# Patient Record
Sex: Male | Born: 1970
Health system: Southern US, Community
[De-identification: ages and names within clinical notes are randomized; demographics above are authoritative.]

## PROBLEM LIST (undated history)

## (undated) DIAGNOSIS — K219 Gastro-esophageal reflux disease without esophagitis: Secondary | ICD-10-CM

## (undated) DIAGNOSIS — I10 Essential (primary) hypertension: Secondary | ICD-10-CM

## (undated) DIAGNOSIS — J329 Chronic sinusitis, unspecified: Secondary | ICD-10-CM

## (undated) DIAGNOSIS — M940 Chondrocostal junction syndrome [Tietze]: Secondary | ICD-10-CM

## (undated) DIAGNOSIS — T7840XA Allergy, unspecified, initial encounter: Secondary | ICD-10-CM

## (undated) DIAGNOSIS — R011 Cardiac murmur, unspecified: Secondary | ICD-10-CM

## (undated) DIAGNOSIS — E785 Hyperlipidemia, unspecified: Secondary | ICD-10-CM

## (undated) HISTORY — DX: Cardiac murmur, unspecified: R01.1

## (undated) HISTORY — DX: Essential (primary) hypertension: I10

## (undated) HISTORY — DX: Hyperlipidemia, unspecified: E78.5

## (undated) HISTORY — DX: Allergy, unspecified, initial encounter: T78.40XA

## (undated) HISTORY — PX: WISDOM TOOTH EXTRACTION: SHX21

## (undated) HISTORY — DX: Chondrocostal junction syndrome (tietze): M94.0

---

## 2011-11-19 ENCOUNTER — Emergency Department (INDEPENDENT_AMBULATORY_CARE_PROVIDER_SITE_OTHER): Payer: PRIVATE HEALTH INSURANCE

## 2011-11-19 ENCOUNTER — Emergency Department
Admission: EM | Admit: 2011-11-19 | Discharge: 2011-11-19 | Disposition: A | Discharge status: Home / Self Care | Payer: PRIVATE HEALTH INSURANCE | Source: Home / Self Care | Attending: Family Medicine | Admitting: Family Medicine

## 2011-11-19 ENCOUNTER — Encounter: Payer: Self-pay | Admitting: Emergency Medicine

## 2011-11-19 DIAGNOSIS — S20219A Contusion of unspecified front wall of thorax, initial encounter: Secondary | ICD-10-CM

## 2011-11-19 DIAGNOSIS — R079 Chest pain, unspecified: Secondary | ICD-10-CM

## 2011-11-19 DIAGNOSIS — IMO0002 Reserved for concepts with insufficient information to code with codable children: Secondary | ICD-10-CM

## 2011-11-19 NOTE — ED Provider Notes (Signed)
History     CSN: [id number]  Arrival date & time 11/19/11  1200   First MD Initiated Contact with Patient 11/19/11 1236      Chief Complaint  Patient presents with  . Rib Injury      HPI Comments: One week ago while working on a step ladder propped against a door, the ladder fell and he landed on top of the ladder striking his left anterior chest.  He has had persistent soreness in his left anterior chest but no shortness of breath  Patient is a 41 y.o. male presenting with chest pain. The history is provided by the patient.  Chest Pain Episode onset: 6 days ago. Chest pain occurs intermittently. The chest pain is improving. The pain is associated with lifting. At its most intense, the pain is at 2/10. The pain is currently at 1/10. The severity of the pain is mild. The quality of the pain is described as aching. The pain does not radiate. Chest pain is worsened by certain positions. Pertinent negatives for primary symptoms include no fever, no shortness of breath, no cough, no wheezing, no palpitations, no nausea and no vomiting. Associated symptoms comments: none. He tried nothing for the symptoms.     History reviewed. No pertinent past medical history.  History reviewed. No pertinent past surgical history.  Family History  Problem Relation Age of Onset  . Hypertension Father     History  Substance Use Topics  . Smoking status: Never Smoker   . Smokeless tobacco: Not on file  . Alcohol Use: Yes      Review of Systems  Constitutional: Negative for fever.  Respiratory: Negative for cough, shortness of breath and wheezing.   Cardiovascular: Positive for chest pain. Negative for palpitations.  Gastrointestinal: Negative for nausea and vomiting.  All other systems reviewed and are negative.    Allergies  Review of patient's allergies indicates no known allergies.  Home Medications  No current outpatient prescriptions on file.  BP 149/85  Pulse 99  Temp 98.2 F  (36.8 C) (Oral)  Resp 18  Ht 5\' 7"  (1.702 m)  Wt 227 lb (102.967 kg)  BMI 35.55 kg/m2  SpO2 97%  Physical Exam  Nursing note and vitals reviewed. Constitutional: He is oriented to person, place, and time. He appears well-developed and well-nourished.       Patient is obese (BMI 35.6)    HENT:  Head: Atraumatic.  Eyes: Conjunctivae normal are normal. Pupils are equal, round, and reactive to light.  Cardiovascular: Normal heart sounds.   Pulmonary/Chest: Breath sounds normal.  Abdominal: Soft. There is no tenderness.  Musculoskeletal:       Arms:      There is tenderness over the left anterior chest as noted on diagram, with a small area of resolving ecchymosis near the sternum.  No crepitance.  Lymphadenopathy:    He has no cervical adenopathy.  Neurological: He is alert and oriented to person, place, and time.  Skin: Skin is warm and dry. No rash noted.    ED Course  Procedures none   Dg Ribs Unilateral W/chest Left  11/19/2011  *RADIOLOGY REPORT*  Clinical Data: Chest pain/rib injury  LEFT RIBS AND CHEST - 3+ VIEW  Comparison: None.  Findings: Lungs are clear. No pleural effusion or pneumothorax.  Cardiomediastinal silhouette is within normal limits.  No displaced left rib fracture is seen.  IMPRESSION: No displaced left rib fracture is seen.   Original Report Authenticated By: Charline Bills, M.D.  1. Contusion, chest wall       MDM   Rib belt applied Wear rib belt Apply heating pad once or twice daily. May take Ibuprofen 200mg , 4 tabs every 8 hours with food. Followup with Dr. Rodney Langton if not improved two weeks        Lattie Haw, MD 11/19/11 [phone]

## 2011-11-19 NOTE — ED Notes (Signed)
Patient reports falling off ladder 6 days ago during home project; ladder also fell on his left rib and abdominal area; now has bruising and some continued discomfort; no pain upon inspiration.

## 2012-09-06 ENCOUNTER — Encounter: Payer: Self-pay | Admitting: *Deleted

## 2012-09-06 ENCOUNTER — Emergency Department
Admission: EM | Admit: 2012-09-06 | Discharge: 2012-09-06 | Disposition: A | Discharge status: Home / Self Care | Payer: PRIVATE HEALTH INSURANCE | Source: Home / Self Care | Attending: Family Medicine | Admitting: Family Medicine

## 2012-09-06 DIAGNOSIS — J029 Acute pharyngitis, unspecified: Secondary | ICD-10-CM

## 2012-09-06 DIAGNOSIS — J069 Acute upper respiratory infection, unspecified: Secondary | ICD-10-CM

## 2012-09-06 MED ORDER — AZITHROMYCIN 250 MG PO TABS: ORAL_TABLET | ORAL | Status: DC

## 2012-09-06 MED ORDER — BENZONATATE 100 MG PO CAPS: ORAL_CAPSULE | ORAL | Status: DC

## 2012-09-06 NOTE — ED Provider Notes (Signed)
History    CSN: [id number] Arrival date & time 09/06/12  1624  First MD Initiated Contact with Patient 09/06/12 1640     Chief Complaint  Patient presents with  . Sore Throat      HPI Comments: Patient complains of four day history of sore throat, sinus congestion, and mild productive cough.  No fevers, chills, and sweats.  He does not feel ill.  The history is provided by the patient.   History reviewed. No pertinent past medical history. History reviewed. No pertinent past surgical history. Family History  Problem Relation Age of Onset  . Hypertension Father    History  Substance Use Topics  . Smoking status: Never Smoker   . Smokeless tobacco: Not on file  . Alcohol Use: Yes    Review of Systems + sore throat + cough No pleuritic pain No wheezing + nasal congestion + post-nasal drainage No sinus pain/pressure No itchy/red eyes No earache No hemoptysis No SOB No fever/chills No nausea No vomiting No abdominal pain No diarrhea No urinary symptoms No skin rashes + fatigue No myalgias No headache Used OTC meds without relief  Allergies  Review of patient's allergies indicates no known allergies.  Home Medications   Current Outpatient Rx  Name  Route  Sig  Dispense  Refill  . azithromycin (ZITHROMAX Z-PAK) 250 MG tablet      Take 2 tabs today; then begin one tab once daily for 4 more days. (Rx void after 09/14/12)   6 each   0   . benzonatate (TESSALON) 100 MG capsule      Take one cap at bedtime as necessary for cough   12 capsule   0    BP 152/95  Pulse 102  Temp(Src) 98.3 F (36.8 C) (Oral)  Resp 18  Ht 5\' 7"  (1.702 m)  Wt 227 lb (102.967 kg)  BMI 35.55 kg/m2  SpO2 98% Physical Exam Nursing notes and Vital Signs reviewed. Appearance:  Patient appears stated age, and in no acute distress.  Patient is obese (BMI 35.6) Eyes:  Pupils are equal, round, and reactive to light and accomodation.  Extraocular movement is intact.  Conjunctivae  are not inflamed  Ears:  Canals normal.  Tympanic membranes normal.  Nose:  Mildly congested turbinates.  No sinus tenderness.   Pharynx:  Normal Neck:  Supple.  Non-tender prominent posterior nodes are palpated bilaterally  Lungs:  Clear to auscultation.  Breath sounds are equal.  Heart:  Regular rate and rhythm without murmurs, rubs, or gallops.  Abdomen:  Nontender without masses or hepatosplenomegaly.  Bowel sounds are present.  No CVA or flank tenderness.  Extremities:  No edema.  No calf tenderness Skin:  No rash present.   ED Course  Procedures  None   Labs Reviewed  POCT RAPID STREP A (OFFICE) negative    1. Acute pharyngitis   2. Acute upper respiratory infections of unspecified site; suspect viral URI Note elevated BP today    MDM  There is no evidence of bacterial infection today.   Treat symptomatically for now  Prescription written for Benzonatate (Tessalon) to take at bedtime for night-time cough.  Take Mucinex D (guaifenesin with decongestant) twice daily for congestion.  Increase fluid intake, rest. May use Afrin nasal spray (or generic oxymetazoline) twice daily for about 5 days.  Also recommend using saline nasal spray several times daily and saline nasal irrigation (AYR is a common brand) Stop all antihistamines for now, and other non-prescription cough/cold  preparations. Begin Azithromycin if not improving about one week or if persistent fever develops (Given a prescription to hold, with an expiration date)  Recommend that he monitor BP Follow-up with family doctor if not improving about10 days.   Lattie Haw, MD 09/06/12 [phone]

## 2012-09-06 NOTE — ED Notes (Signed)
Pt c/o sore throat and nasal congestion x 3 days. Denies fever.

## 2012-09-08 ENCOUNTER — Telehealth: Payer: Self-pay | Admitting: Emergency Medicine

## 2012-09-14 ENCOUNTER — Emergency Department (INDEPENDENT_AMBULATORY_CARE_PROVIDER_SITE_OTHER): Payer: PRIVATE HEALTH INSURANCE

## 2012-09-14 ENCOUNTER — Emergency Department
Admission: EM | Admit: 2012-09-14 | Discharge: 2012-09-14 | Disposition: A | Discharge status: Home / Self Care | Payer: PRIVATE HEALTH INSURANCE | Source: Home / Self Care | Attending: Family Medicine | Admitting: Family Medicine

## 2012-09-14 ENCOUNTER — Encounter: Payer: Self-pay | Admitting: *Deleted

## 2012-09-14 DIAGNOSIS — R51 Headache: Secondary | ICD-10-CM

## 2012-09-14 DIAGNOSIS — J320 Chronic maxillary sinusitis: Secondary | ICD-10-CM

## 2012-09-14 DIAGNOSIS — J0100 Acute maxillary sinusitis, unspecified: Secondary | ICD-10-CM

## 2012-09-14 MED ORDER — FLUTICASONE PROPIONATE 50 MCG/ACT NA SUSP: NASAL | Status: DC

## 2012-09-14 MED ORDER — AMOXICILLIN 875 MG PO TABS: 875.0000 mg | ORAL_TABLET | Freq: Two times a day (BID) | ORAL | Status: DC

## 2012-09-14 NOTE — ED Notes (Signed)
Kevin Bailey c/o sinus congestion and sinus pain with cough at night. He was treated last week for UTI symptoms and completed z-pack. He "feels like it moved to my sinuses".

## 2012-09-14 NOTE — ED Provider Notes (Signed)
CSN: [id number]     Arrival date & time 09/14/12  1100 History     First MD Initiated Contact with Patient 09/14/12 1115     Chief Complaint  Patient presents with  . Sinus Problem      HPI Comments: Patient reports that his cough has improved but he has persistent pressure sensation in his sinuses, worse on the left.  Three days ago he had pain in his left upper teeth.  He has persistent post-nasal drainage.  No fevers, chills, and sweats.  He has a history of seasonal rhinitis.  The history is provided by the patient.    History reviewed. No pertinent past medical history. History reviewed. No pertinent past surgical history. Family History  Problem Relation Age of Onset  . Hypertension Father    History  Substance Use Topics  . Smoking status: Never Smoker   . Smokeless tobacco: Not on file  . Alcohol Use: Yes    Review of Systems No sore throat + cough No pleuritic pain No wheezing + nasal congestion + post-nasal drainage + sinus pain/pressure No itchy/red eyes No earache No hemoptysis No SOB No fever/chills No nausea No vomiting No abdominal pain No diarrhea No urinary symptoms No skin rashes No fatigue No myalgias No headache    Allergies  Review of patient's allergies indicates no known allergies.  Home Medications   Current Outpatient Rx  Name  Route  Sig  Dispense  Refill  . amoxicillin (AMOXIL) 875 MG tablet   Oral   Take 1 tablet (875 mg total) by mouth 2 (two) times daily.   20 tablet   0   . azithromycin (ZITHROMAX Z-PAK) 250 MG tablet      Take 2 tabs today; then begin one tab once daily for 4 more days. (Rx void after 09/14/12)   6 each   0   . benzonatate (TESSALON) 100 MG capsule      Take one cap at bedtime as necessary for cough   12 capsule   0   . fluticasone (FLONASE) 50 MCG/ACT nasal spray      Place two sprays in each nostril once daily   16 g   1    BP 133/83  Pulse 111  Temp(Src) 98.2 F (36.8 C) (Oral)   Resp 18  Ht 5\' 7"  (1.702 m)  Wt 223 lb (101.152 kg)  BMI 34.92 kg/m2  SpO2 95% Physical Exam Nursing notes and Vital Signs reviewed. Appearance:  Patient appears stated age, and in no acute distress.  Patient is obese (BMI 34.9) Eyes:  Pupils are equal, round, and reactive to light and accomodation.  Extraocular movement is intact.  Conjunctivae are not inflamed  Ears:  Canals normal.  Tympanic membranes normal.  Nose:  Congested turbinates.  Mild left maxillary sinus tenderness is present.  Pharynx:  Normal Neck:  Supple.   No adenopathy Lungs:  Clear to auscultation.  Breath sounds are equal.  Heart:  Regular rate and rhythm without murmurs, rubs, or gallops.  Skin:  No rash present.    ED Course   Procedures  none  Labs Reviewed -   Dg Sinuses Complete  09/14/2012   *RADIOLOGY REPORT*  Clinical Data: Persistent sinus congestion and left facial pain.  PARANASAL SINUSES - COMPLETE 3 + VIEW  Comparison: None  Findings: There is acute left maxillary sinusitis with air-fluid levels.  The right maxillary sinus is clear and the frontal and ethmoid sinuses appear clear.  The sphenoid  sinus is clear on the lateral film.  IMPRESSION: Acute left maxillary sinusitis.   Original Report Authenticated By: Rudie Meyer, M.D.   1. Left maxillary sinusitis     MDM  Begin amoxicillin for 10 days.  Begin Flonase. Take plain Mucinex (guaifenesin) twice daily for cough and congestion.  Increase fluid intake, rest. May use Afrin nasal spray (or generic oxymetazoline) twice daily for about 5 days.  Also recommend using saline nasal spray several times daily and saline nasal irrigation (AYR is a common brand).  Use Flonase spray after using Afrin and saline irrigation. Stop all antihistamines for now, and other non-prescription cough/cold preparations. Followup with ENT if not improving.  Lattie Haw, MD 09/14/12 1218

## 2012-09-16 ENCOUNTER — Telehealth: Payer: Self-pay | Admitting: Emergency Medicine

## 2013-05-19 ENCOUNTER — Encounter: Payer: Self-pay | Admitting: Emergency Medicine

## 2013-05-19 ENCOUNTER — Emergency Department (INDEPENDENT_AMBULATORY_CARE_PROVIDER_SITE_OTHER)
Admission: EM | Admit: 2013-05-19 | Discharge: 2013-05-19 | Disposition: A | Discharge status: Home / Self Care | Payer: PRIVATE HEALTH INSURANCE | Source: Home / Self Care

## 2013-05-19 DIAGNOSIS — J0100 Acute maxillary sinusitis, unspecified: Secondary | ICD-10-CM

## 2013-05-19 DIAGNOSIS — J309 Allergic rhinitis, unspecified: Secondary | ICD-10-CM

## 2013-05-19 MED ORDER — AMOXICILLIN 875 MG PO TABS: 875.0000 mg | ORAL_TABLET | Freq: Two times a day (BID) | ORAL | Status: DC

## 2013-05-19 MED ORDER — AZITHROMYCIN 250 MG PO TABS: ORAL_TABLET | ORAL | Status: DC

## 2013-05-19 MED ORDER — FLUTICASONE PROPIONATE 50 MCG/ACT NA SUSP: NASAL | Status: DC

## 2013-05-19 NOTE — Discharge Instructions (Signed)
Take plain Mucinex (1200 mg guaifenesin) twice daily for cough and congestion.  Add Sudafed for sinus congestion.   Increase fluid intake. Use Afrin nasal spray (or generic oxymetazoline) twice daily for about 5 days.  Also recommend using saline nasal spray several times daily and saline nasal irrigation (AYR is a common brand).  Use Flonase spray after Afrin spray and saline rinse. Continue Claritin, Allegra, or Zyrtec Follow-up with family doctor if not improving 7 to 10 days.

## 2013-05-19 NOTE — ED Provider Notes (Signed)
CSN: [id number]     Arrival date & time 05/19/13  1102 History   None    Chief Complaint  Patient presents with  . Congestion/drainage in throat     x 1.5 weeks      HPI Comments: Patient complains of sinus congestion, facial pain, sneezing, and post-nasal drainage for about 1.5 weeks.  No sore throat, cough, or fever.  He has a history of seasonal allergies.  The history is provided by the patient.    History reviewed. No pertinent past medical history. Past Surgical History  Procedure Laterality Date  . Wisdom tooth extraction     Family History  Problem Relation Age of Onset  . Hypertension Father    History  Substance Use Topics  . Smoking status: Never Smoker   . Smokeless tobacco: Never Used  . Alcohol Use: Yes    Review of Systems No sore throat No cough No pleuritic pain No wheezing + nasal congestion + post-nasal drainage + sinus pain/pressure No itchy/red eyes No earache No hemoptysis No SOB No fever/chills No nausea No vomiting No abdominal pain No diarrhea No urinary symptoms No skin rash No fatigue No myalgias No headache Used OTC meds without relief  Allergies  Review of patient's allergies indicates no known allergies.  Home Medications   Current Outpatient Rx  Name  Route  Sig  Dispense  Refill  . Pseudoephedrine-APAP-DM (DAYQUIL MULTI-SYMPTOM COLD/FLU PO)   Oral   Take by mouth.         Marland Kitchen amoxicillin (AMOXIL) 875 MG tablet   Oral   Take 1 tablet (875 mg total) by mouth 2 (two) times daily.   20 tablet   0   . azithromycin (ZITHROMAX Z-PAK) 250 MG tablet      Take 2 tabs today; then begin one tab once daily for 4 more days.   6 each   0   . fluticasone (FLONASE) 50 MCG/ACT nasal spray      Place two sprays in each nostril once daily   16 g   1    BP 147/91  Pulse 89  Temp(Src) 98.2 F (36.8 C) (Oral)  Resp 16  Ht 5\' 7"  (1.702 m)  Wt 238 lb (107.956 kg)  BMI 37.27 kg/m2  SpO2 96% Physical Exam Nursing notes  and Vital Signs reviewed. Appearance:  Patient appears stated age, and in no acute distress.  Patient is obese (BMI 37.3) Eyes:  Pupils are equal, round, and reactive to light and accomodation.  Extraocular movement is intact.  Conjunctivae are not inflamed  Ears:  Canals normal.  Tympanic membranes normal.  Nose:  Congested turbinates.  Maxillary sinus tenderness is present.  Pharynx:  Normal Neck:  Supple.  No adenopathy Lungs:  Clear to auscultation.  Breath sounds are equal.  Heart:  Regular rate and rhythm without murmurs, rubs, or gallops.  Abdomen:  Nontender without masses or hepatosplenomegaly.  Bowel sounds are present.  No CVA or flank tenderness.  Extremities:  No edema.  No calf tenderness Skin:  No rash present.   ED Course  Procedures  None      MDM   1. Allergic rhinitis   2. Acute maxillary sinusitis    Begin amoxicillin.  Refill Flonase Take plain Mucinex (1200 mg guaifenesin) twice daily for cough and congestion.  Add Sudafed for sinus congestion.   Increase fluid intake. Use Afrin nasal spray (or generic oxymetazoline) twice daily for about 5 days.  Also recommend using saline nasal  spray several times daily and saline nasal irrigation (AYR is a common brand).  Use Flonase spray after Afrin spray and saline rinse. Continue Claritin, Allegra, or Zyrtec Follow-up with family doctor if not improving 7 to 10 days.     Lattie HawStephen A Kynslie Ringle, MD 05/21/13 1021

## 2013-05-19 NOTE — ED Notes (Signed)
Kevin Bailey complains of congestion/drainage in throat for 1.5 weeks. Denies fevers, chills, sweats, headaches, sore throat, runny nose or cough. He has tried DayQuil with little relief.

## 2014-03-28 ENCOUNTER — Emergency Department
Admission: EM | Admit: 2014-03-28 | Discharge: 2014-03-28 | Disposition: A | Discharge status: Home / Self Care | Payer: PRIVATE HEALTH INSURANCE | Source: Home / Self Care

## 2014-03-28 ENCOUNTER — Encounter: Payer: Self-pay | Admitting: Emergency Medicine

## 2014-03-28 DIAGNOSIS — M940 Chondrocostal junction syndrome [Tietze]: Secondary | ICD-10-CM

## 2014-03-28 MED ORDER — MELOXICAM 15 MG PO TABS: 15.0000 mg | ORAL_TABLET | Freq: Every day | ORAL | Status: DC

## 2014-03-28 NOTE — Discharge Instructions (Signed)
Recommend trial of Prilosec OTC (20mg  omeprazole) 30 minutes before supper each evening.   Chest Wall Pain Chest wall pain is pain in or around the bones and muscles of your chest. It may take up to 6 weeks to get better. It may take longer if you must stay physically active in your work and activities.  CAUSES  Chest wall pain may happen on its own. However, it may be caused by:  A viral illness like the flu.  Injury.  Coughing.  Exercise.  Arthritis.  Fibromyalgia.  Shingles. HOME CARE INSTRUCTIONS   Avoid overtiring physical activity. Try not to strain or perform activities that cause pain. This includes any activities using your chest or your abdominal and side muscles, especially if heavy weights are used.  Put ice on the sore area.  Put ice in a plastic bag.  Place a towel between your skin and the bag.  Leave the ice on for 15-20 minutes per hour while awake for the first 2 days.  Only take over-the-counter or prescription medicines for pain, discomfort, or fever as directed by your caregiver. SEEK IMMEDIATE MEDICAL CARE IF:   Your pain increases, or you are very uncomfortable.  You have a fever.  Your chest pain becomes worse.  You have new, unexplained symptoms.  You have nausea or vomiting.  You feel sweaty or lightheaded.  You have a cough with phlegm (sputum), or you cough up blood. MAKE SURE YOU:   Understand these instructions.  Will watch your condition.  Will get help right away if you are not doing well or get worse. Document Released: 02/07/2005 Document Revised: 05/02/2011 Document Reviewed: 10/04/2010 [facility] Patient Information 2015 [place], [place]. This information is not intended to replace advice given to you by your health care provider. Make sure you discuss any questions you have with your health care provider.

## 2014-03-28 NOTE — ED Provider Notes (Signed)
CSN: [id number]     Arrival date & time 03/28/14  0805 History   None    Chief Complaint  Patient presents with  . Anxiety      HPI Comments: Patient reports that about two weeks ago he shovelled snow vigorously for 45 minutes.  He had eaten about two hours prior.  Since then he has had an intermittent vague sensation in his anterior chest, sometimes feeling like a restriction to deep inspiration, and somewhat worse with movement.  He has also noted several episodes of mild indigestion at night, and upon awakening he has a cough and a sensation of phlegm in his throat.  The symptoms do not awaken him at night.  He denies distinct chest pain.  No shortness of breath.  No fevers, chills, and sweats.  No recent URI.  Yesterday evening he took two aspirin tabs and his symptoms improved. Family history of MI in his paternal grandfather age 42  Patient is a 44 y.o. male presenting with chest pain. The history is provided by the patient.  Chest Pain Pain location:  Substernal area Pain quality: aching, dull and tightness   Pain quality: not radiating and not stabbing   Pain radiates to:  Does not radiate Pain radiates to the back: no   Pain severity:  Mild Onset quality:  Gradual Duration:  2 weeks Timing:  Intermittent Progression:  Unchanged Chronicity:  New Context: breathing, lifting, movement and at rest   Relieved by:  Aspirin Worsened by:  Movement, deep breathing and certain positions Associated symptoms: AICD problem, anxiety and heartburn   Associated symptoms: no abdominal pain, no anorexia, no cough, no diaphoresis, no dysphagia, no fatigue, no fever, no lower extremity edema, no nausea, no palpitations, no shortness of breath and not vomiting   Risk factors: male sex and obesity     History reviewed. No pertinent past medical history. Past Surgical History  Procedure Laterality Date  . Wisdom tooth extraction     Family History  Problem Relation Age of Onset  . Hypertension  Father    History  Substance Use Topics  . Smoking status: Never Smoker   . Smokeless tobacco: Never Used  . Alcohol Use: Yes    Review of Systems  Constitutional: Negative for fever, diaphoresis and fatigue.  HENT: Negative for trouble swallowing.   Respiratory: Negative for cough and shortness of breath.   Cardiovascular: Positive for chest pain. Negative for palpitations.  Gastrointestinal: Positive for heartburn. Negative for nausea, vomiting, abdominal pain and anorexia.  All other systems reviewed and are negative.   Allergies  Review of patient's allergies indicates not on file.  Home Medications   Prior to Admission medications   Medication Sig Start Date End Date Taking? Authorizing Provider  fluticasone (FLONASE) 50 MCG/ACT nasal spray Place two sprays in each nostril once daily 05/19/13   Lattie HawStephen A Gwen Edler, MD  meloxicam (MOBIC) 15 MG tablet Take 1 tablet (15 mg total) by mouth daily. Take with food each morning 03/28/14   Lattie HawStephen A Issis Lindseth, MD   BP 149/87 mmHg  Pulse 87  Temp(Src) 98.6 F (37 C) (Oral)  Ht 5\' 7"  (1.702 m)  Wt 233 lb (105.688 kg)  BMI 36.48 kg/m2  SpO2 97% Physical Exam Nursing notes and Vital Signs reviewed. Appearance:  Patient appears stated age, and in no acute distress.  Patient is obese (BMI 36.5) Eyes:  Pupils are equal, round, and reactive to light and accomodation.  Extraocular movement is intact.  Conjunctivae  are not inflamed   Pharynx:  Normal Neck:  Supple.  No adenopathy Lungs:  Clear to auscultation.  Breath sounds are equal.  Heart:  Regular rate and rhythm without murmurs, rubs, or gallops. Chest:  Distinct tenderness to palpation over the mid-sternum.  There is also tenderness over pectoralis muscles bilaterally with resisted contraction of the pectoralis muscles. Abdomen:  Nontender without masses or hepatosplenomegaly.  Bowel sounds are present.  No CVA or flank tenderness.  Extremities:  No edema.  No calf tenderness Skin:  No  rash present.    ED Course  Procedures  None     Labs Reviewed -  EKG: Rate:  90 BPM PR:  120 msec QT:  360 msec QTcH:  412 msec QRSD:  92 msec QRS axis:  28 degrees Interpretation:  Normal sinus rhythm; within normal limits        MDM   1. Costochondritis; suspect a contributing factor of mild nocturnal acid reflux    Rx for Mobic , one QAM.  Apply ice pack to sternum several times daily. Recommend trial of Prilosec OTC (  omeprazole) 30 minutes before supper each evening. Followup with Family Doctor if not improved in about two weeks.     Lattie Haw, MD 03/28/14 [phone]

## 2014-03-28 NOTE — ED Notes (Signed)
Patient was shoveling snow 2 weeks ago, since then when he lays down feels nervousness, indigestion, shakey. Denies chest pain, SOB, nausea.

## 2014-04-04 ENCOUNTER — Ambulatory Visit (INDEPENDENT_AMBULATORY_CARE_PROVIDER_SITE_OTHER): Payer: PRIVATE HEALTH INSURANCE | Admitting: Family Medicine

## 2014-04-04 ENCOUNTER — Encounter: Payer: Self-pay | Admitting: Family Medicine

## 2014-04-04 VITALS — BP 144/92 | HR 94 | Ht 67.0 in | Wt 231.0 lb

## 2014-04-04 DIAGNOSIS — I10 Essential (primary) hypertension: Secondary | ICD-10-CM | POA: Insufficient documentation

## 2014-04-04 DIAGNOSIS — Z0000 Encounter for general adult medical examination without abnormal findings: Secondary | ICD-10-CM

## 2014-04-04 NOTE — Patient Instructions (Signed)
DASH Eating Plan °DASH stands for "Dietary Approaches to Stop Hypertension." The DASH eating plan is a healthy eating plan that has been shown to reduce high blood pressure (hypertension). Additional health benefits may include reducing the risk of type 2 diabetes mellitus, heart disease, and stroke. The DASH eating plan may also help with weight loss. °WHAT DO I NEED TO KNOW ABOUT THE DASH EATING PLAN? °For the DASH eating plan, you will follow these general guidelines: °· Choose foods with a percent daily value for sodium of less than 5% (as listed on the food label). °· Use salt-free seasonings or herbs instead of table salt or sea salt. °· Check with your health care provider or pharmacist before using salt substitutes. °· Eat lower-sodium products, often labeled as "lower sodium" or "no salt added." °· Eat fresh foods. °· Eat more vegetables, fruits, and low-fat dairy products. °· Choose whole grains. Look for the word "whole" as the first word in the ingredient list. °· Choose fish and skinless chicken or turkey more often than red meat. Limit fish, poultry, and meat to 6 oz (170 g) each day. °· Limit sweets, desserts, sugars, and sugary drinks. °· Choose heart-healthy fats. °· Limit cheese to 1 oz (28 g) per day. °· Eat more home-cooked food and less restaurant, buffet, and fast food. °· Limit fried foods. °· Cook foods using methods other than frying. °· Limit canned vegetables. If you do use them, rinse them well to decrease the sodium. °· When eating at a restaurant, ask that your food be prepared with less salt, or no salt if possible. °WHAT FOODS CAN I EAT? °Seek help from a dietitian for individual calorie needs. °Grains °Whole grain or whole wheat bread. Brown rice. Whole grain or whole wheat pasta. Quinoa, bulgur, and whole grain cereals. Low-sodium cereals. Corn or whole wheat flour tortillas. Whole grain cornbread. Whole grain crackers. Low-sodium crackers. °Vegetables °Fresh or frozen vegetables  (raw, steamed, roasted, or grilled). Low-sodium or reduced-sodium tomato and vegetable juices. Low-sodium or reduced-sodium tomato sauce and paste. Low-sodium or reduced-sodium canned vegetables.  °Fruits °All fresh, canned (in natural juice), or frozen fruits. °Meat and Other Protein Products °Ground beef (85% or leaner), grass-fed beef, or beef trimmed of fat. Skinless chicken or turkey. Ground chicken or turkey. Pork trimmed of fat. All fish and seafood. Eggs. Dried beans, peas, or lentils. Unsalted nuts and seeds. Unsalted canned beans. °Dairy °Low-fat dairy products, such as skim or 1% milk, 2% or reduced-fat cheeses, low-fat ricotta or cottage cheese, or plain low-fat yogurt. Low-sodium or reduced-sodium cheeses. °Fats and Oils °Tub margarines without trans fats. Light or reduced-fat mayonnaise and salad dressings (reduced sodium). Avocado. Safflower, olive, or canola oils. Natural peanut or almond butter. °Other °Unsalted popcorn and pretzels. °The items listed above may not be a complete list of recommended foods or beverages. Contact your dietitian for more options. °WHAT FOODS ARE NOT RECOMMENDED? °Grains °White bread. White pasta. White rice. Refined cornbread. Bagels and croissants. Crackers that contain trans fat. °Vegetables °Creamed or fried vegetables. Vegetables in a cheese sauce. Regular canned vegetables. Regular canned tomato sauce and paste. Regular tomato and vegetable juices. °Fruits °Dried fruits. Canned fruit in light or heavy syrup. Fruit juice. °Meat and Other Protein Products °Fatty cuts of meat. Ribs, chicken wings, bacon, sausage, bologna, salami, chitterlings, fatback, hot dogs, bratwurst, and packaged luncheon meats. Salted nuts and seeds. Canned beans with salt. °Dairy °Whole or 2% milk, cream, half-and-half, and cream cheese. Whole-fat or sweetened yogurt. Full-fat   cheeses or blue cheese. Nondairy creamers and whipped toppings. Processed cheese, cheese spreads, or cheese  curds. °Condiments °Onion and garlic salt, seasoned salt, table salt, and sea salt. Canned and packaged gravies. Worcestershire sauce. Tartar sauce. Barbecue sauce. Teriyaki sauce. Soy sauce, including reduced sodium. Steak sauce. Fish sauce. Oyster sauce. Cocktail sauce. Horseradish. Ketchup and mustard. Meat flavorings and tenderizers. Bouillon cubes. Hot sauce. Tabasco sauce. Marinades. Taco seasonings. Relishes. °Fats and Oils °Butter, stick margarine, lard, shortening, ghee, and bacon fat. Coconut, palm kernel, or palm oils. Regular salad dressings. °Other °Pickles and olives. Salted popcorn and pretzels. °The items listed above may not be a complete list of foods and beverages to avoid. Contact your dietitian for more information. °WHERE CAN I FIND MORE INFORMATION? °National [employer], [employer], [employer]: www.nhlbi.nih.gov/health/health-topics/topics/dash/ °Document Released: 01/27/2011 Document Revised: 06/24/2013 Document Reviewed: 12/12/2012 °Exit[employer]® Patient Information ©2015 [employer]. This information is not intended to replace advice given to you by your health care provider. Make sure you discuss any questions you have with your health care provider. ° °

## 2014-04-04 NOTE — Progress Notes (Signed)
CC: Kevin Bailey is a 44 y.o. male is here for Establish Care   Subjective: HPI:  Colonoscopy: No family history of colon cancer will begin screening at age 89 Prostate: Discussed screening risks/beneifts with patient today, consider screening at age 40  Influenza Vaccine: Up-to-date Pneumovax: No current indication Td/Tdap: declined Zoster: (Start 42 yo)  Pleasant 44 year old here to establish care no acute complaints  Occasional alcohol use no tobacco or recreational drug use  Review of Systems - General ROS: negative for - chills, fever, night sweats, weight gain or weight loss Ophthalmic ROS: negative for - decreased vision Psychological ROS: negative for - anxiety or depression ENT ROS: negative for - hearing change, nasal congestion, tinnitus or allergies Hematological and Lymphatic ROS: negative for - bleeding problems, bruising or swollen lymph nodes Breast ROS: negative Respiratory ROS: no cough, shortness of breath, or wheezing Cardiovascular ROS: no chest pain or dyspnea on exertion Gastrointestinal ROS: no abdominal pain, change in bowel habits, or black or bloody stools Genito-Urinary ROS: negative for - genital discharge, genital ulcers, incontinence or abnormal bleeding from genitals Musculoskeletal ROS: negative for - joint pain or muscle pain Neurological ROS: negative for - headaches or memory loss Dermatological ROS: negative for lumps, mole changes, rash and skin lesion changes  History reviewed. No pertinent past medical history.  Past Surgical History  Procedure Laterality Date  . Wisdom tooth extraction     Family History  Problem Relation Age of Onset  . Hypertension Father   . Diabetes      grandmother     History   Social History  . Marital Status: Married    Spouse Name: N/A  . Number of Children: N/A  . Years of Education: N/A   Occupational History  . Not on file.   Social History Main Topics  . Smoking status: Never Smoker    . Smokeless tobacco: Never Used  . Alcohol Use: 1.2 oz/week    2 Standard drinks or equivalent per week  . Drug Use: No  . Sexual Activity:    Partners: Female   Other Topics Concern  . Not on file   Social History Narrative     ComparedObjective: BP 144/92 mmHg  Pulse 94  Ht $R'5\' 7"'Rt$  (1.702 m)  Wt 231 lb (104.781 kg)  BMI 36.17 kg/m2  General: No Acute Distress HEENT: Atraumatic, normocephalic, conjunctivae normal without scleral icterus.  No nasal discharge, hearing grossly intact, TMs with good landmarks bilaterally with no middle ear abnormalities, posterior pharynx clear without oral lesions. Neck: Supple, trachea midline, no cervical nor supraclavicular adenopathy. Pulmonary: Clear to auscultation bilaterally without wheezing, rhonchi, nor rales. Cardiac: Regular rate and rhythm.  No murmurs, rubs, nor gallops. No peripheral edema.  2+ peripheral pulses bilaterally. Abdomen: Bowel sounds normal.  No masses.  Non-tender without rebound.  Negative Murphy's sign. MSK: Grossly intact, no signs of weakness.  Full strength throughout upper and lower extremities.  Full ROM in upper and lower extremities.  No midline spinal tenderness. Neuro: Gait unremarkable, CN II-XII grossly intact.  C5-C6 Reflex 2/4 Bilaterally, L4 Reflex 2/4 Bilaterally.  Cerebellar function intact. Skin: No rashes. Psych: Alert and oriented to person/place/time.  Thought process normal. No anxiety/depression. Assessment & Plan: [name] was seen today for establish care.  Diagnoses and all orders for this visit:  Annual physical exam Orders: -     CBC -     Comp Met (CMET) -     Lipid panel   Healthy lifestyle interventions  including but not limited to regular exercise, a healthy low fat diet, moderation of salt intake, the dangers of tobacco/alcohol/recreational drug use, nutrition supplementation, and accident avoidance were discussed with the patient and a handout was provided for future  reference.  Discussed sodium restriction to help with reducing his blood pressure, pointed out today that this is above goal.  Return in about 3 months (around 07/03/2014) for Blood Pressure Review.

## 2014-04-14 ENCOUNTER — Telehealth: Payer: Self-pay | Admitting: *Deleted

## 2014-04-14 MED ORDER — MELOXICAM 15 MG PO TABS: 15.0000 mg | ORAL_TABLET | Freq: Every day | ORAL | Status: DC

## 2014-04-14 NOTE — Telephone Encounter (Signed)
Rx sent 

## 2014-04-14 NOTE — Telephone Encounter (Signed)
Pt request a refill of meloxicam sent to [facility] in [place]. Routing to provider since originally rx'd in [facility].

## 2014-04-16 NOTE — Telephone Encounter (Signed)
Pt aware.

## 2014-04-17 IMAGING — CR DG SINUSES COMPLETE 3+V
3 series · 3 of 3 positions shown · non-contrast
Comparison: None

CLINICAL DATA: Persistent sinus congestion and left facial pain.

PARANASAL SINUSES - COMPLETE 3 + VIEW

[view not recorded (1 of 3)]
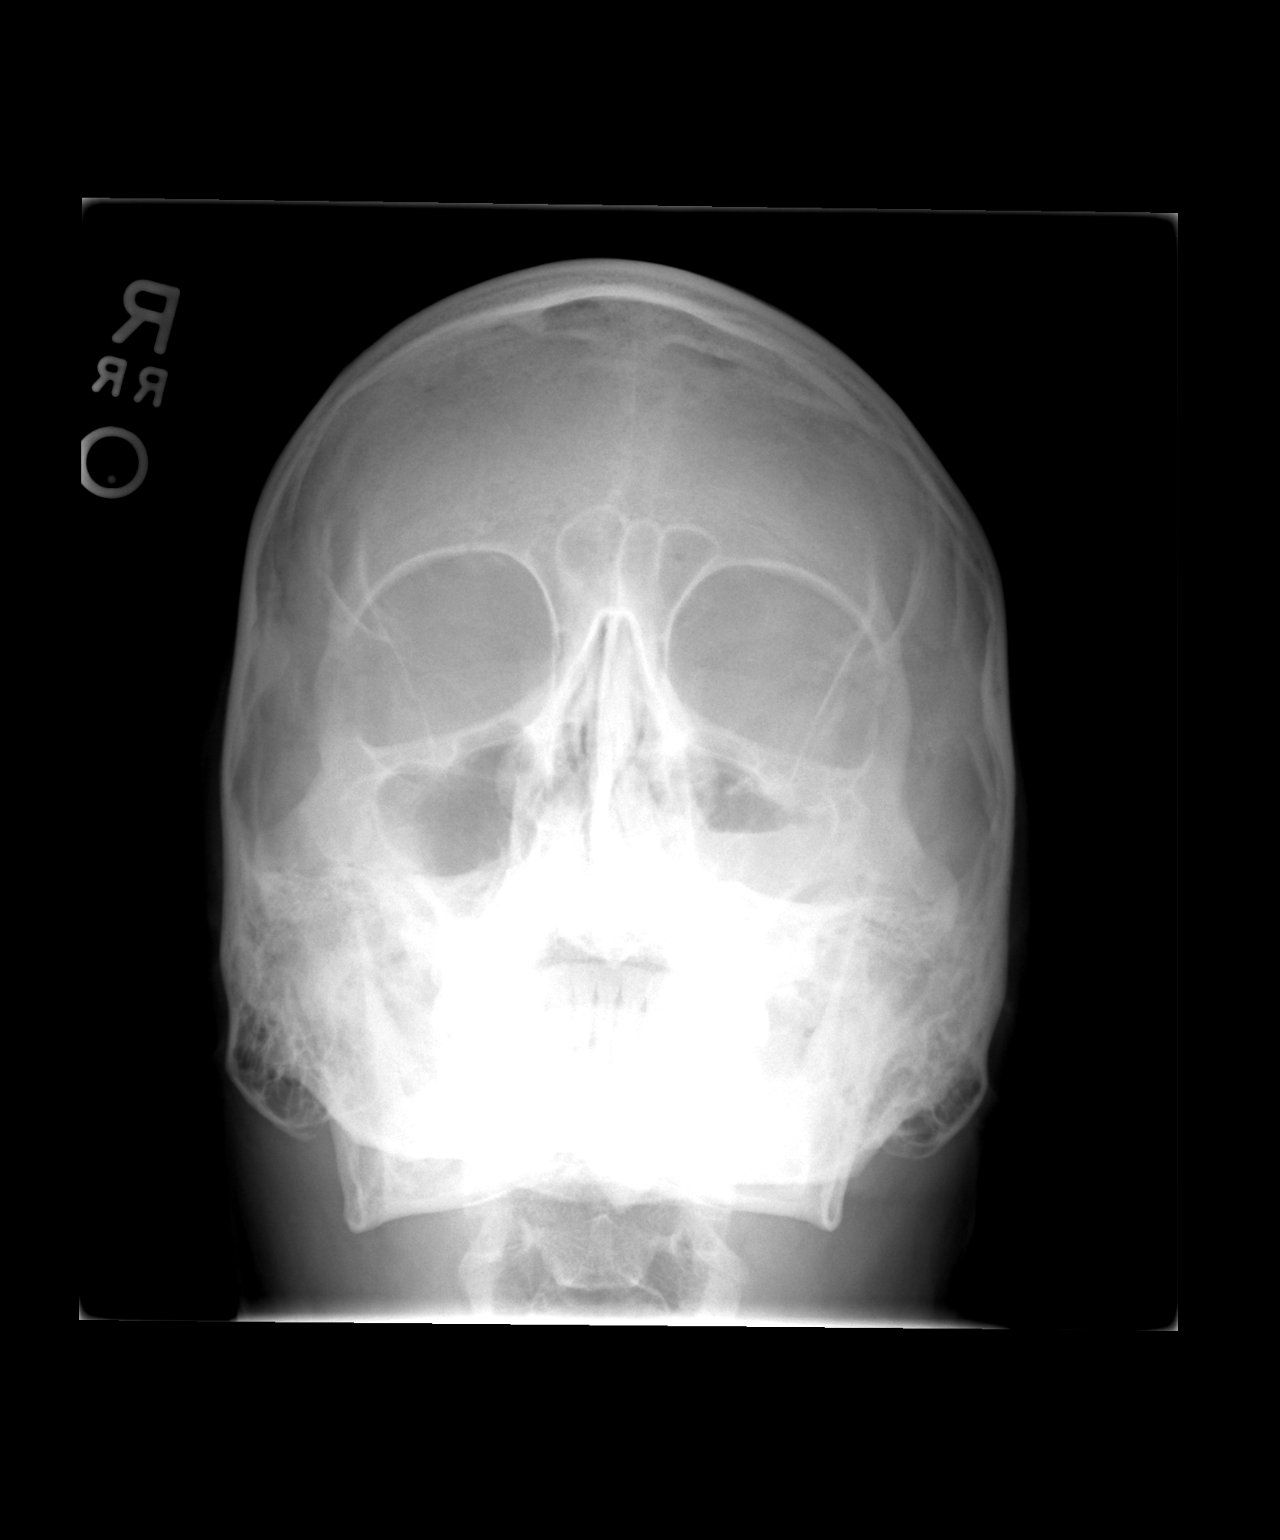

[view not recorded (2 of 3)]
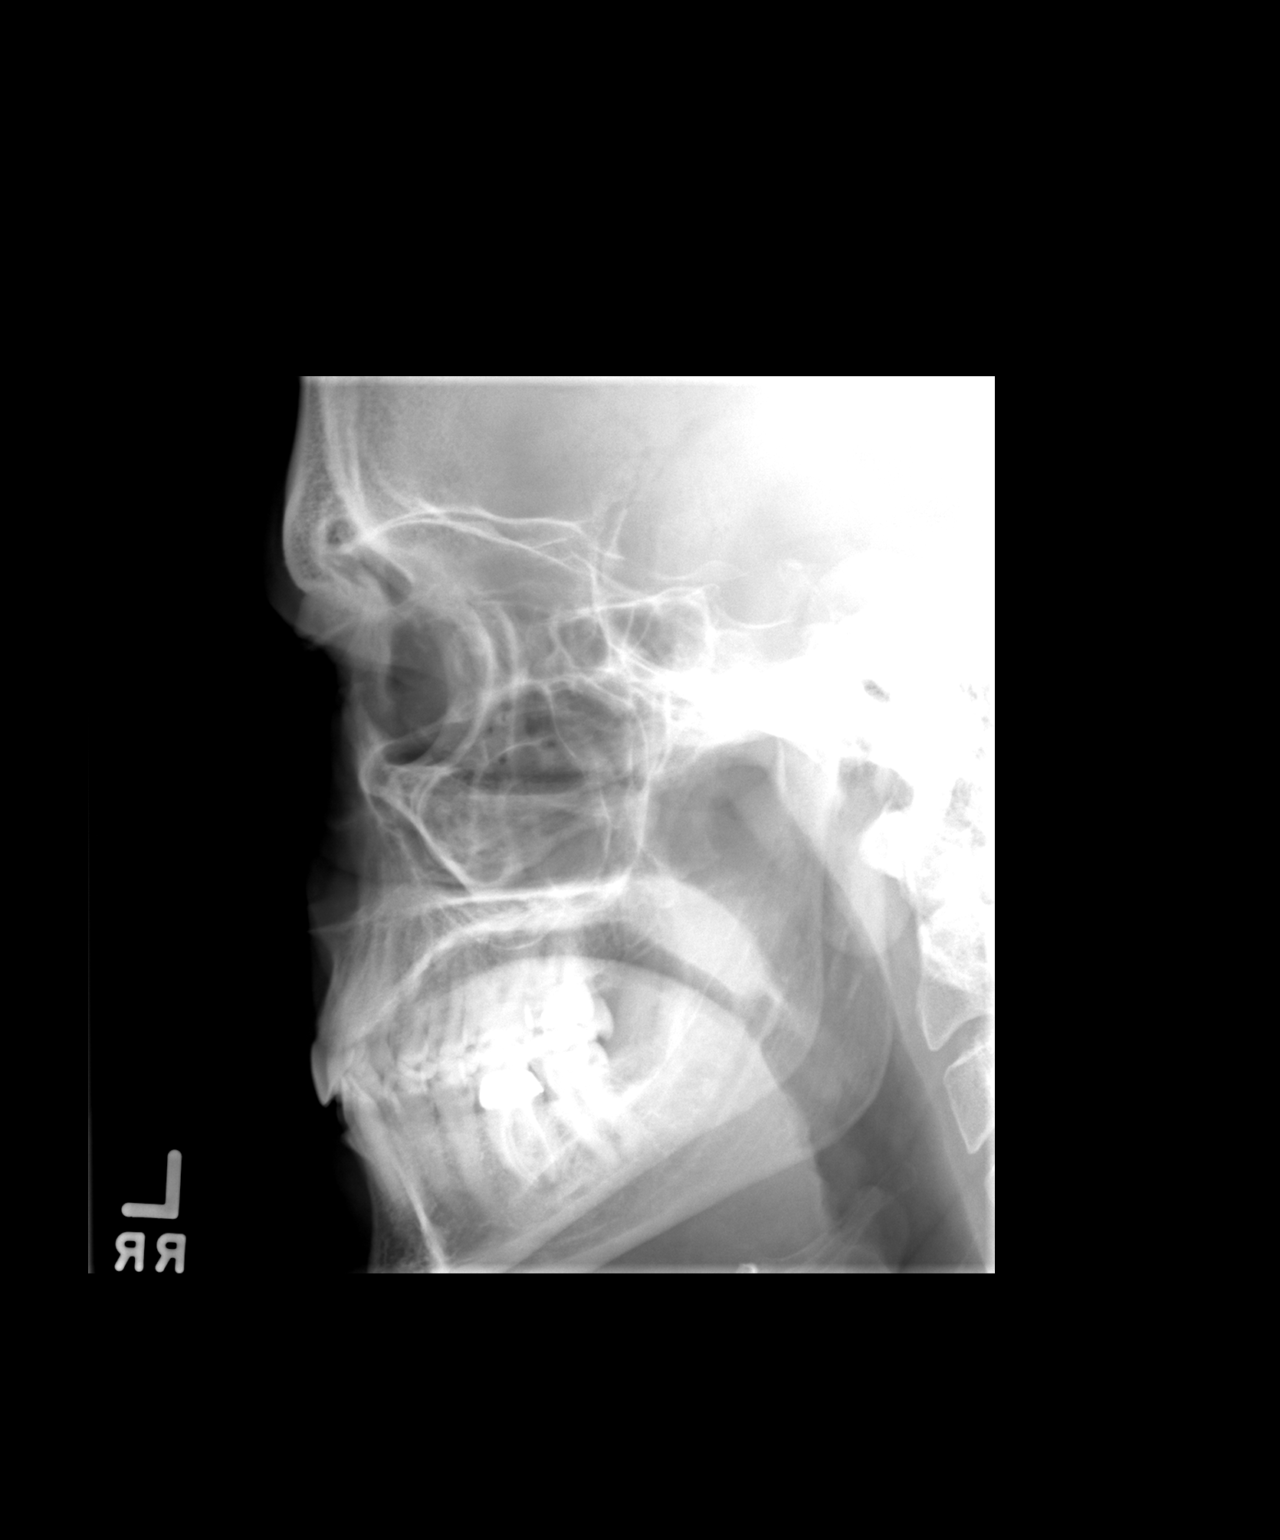

[view not recorded (3 of 3)]
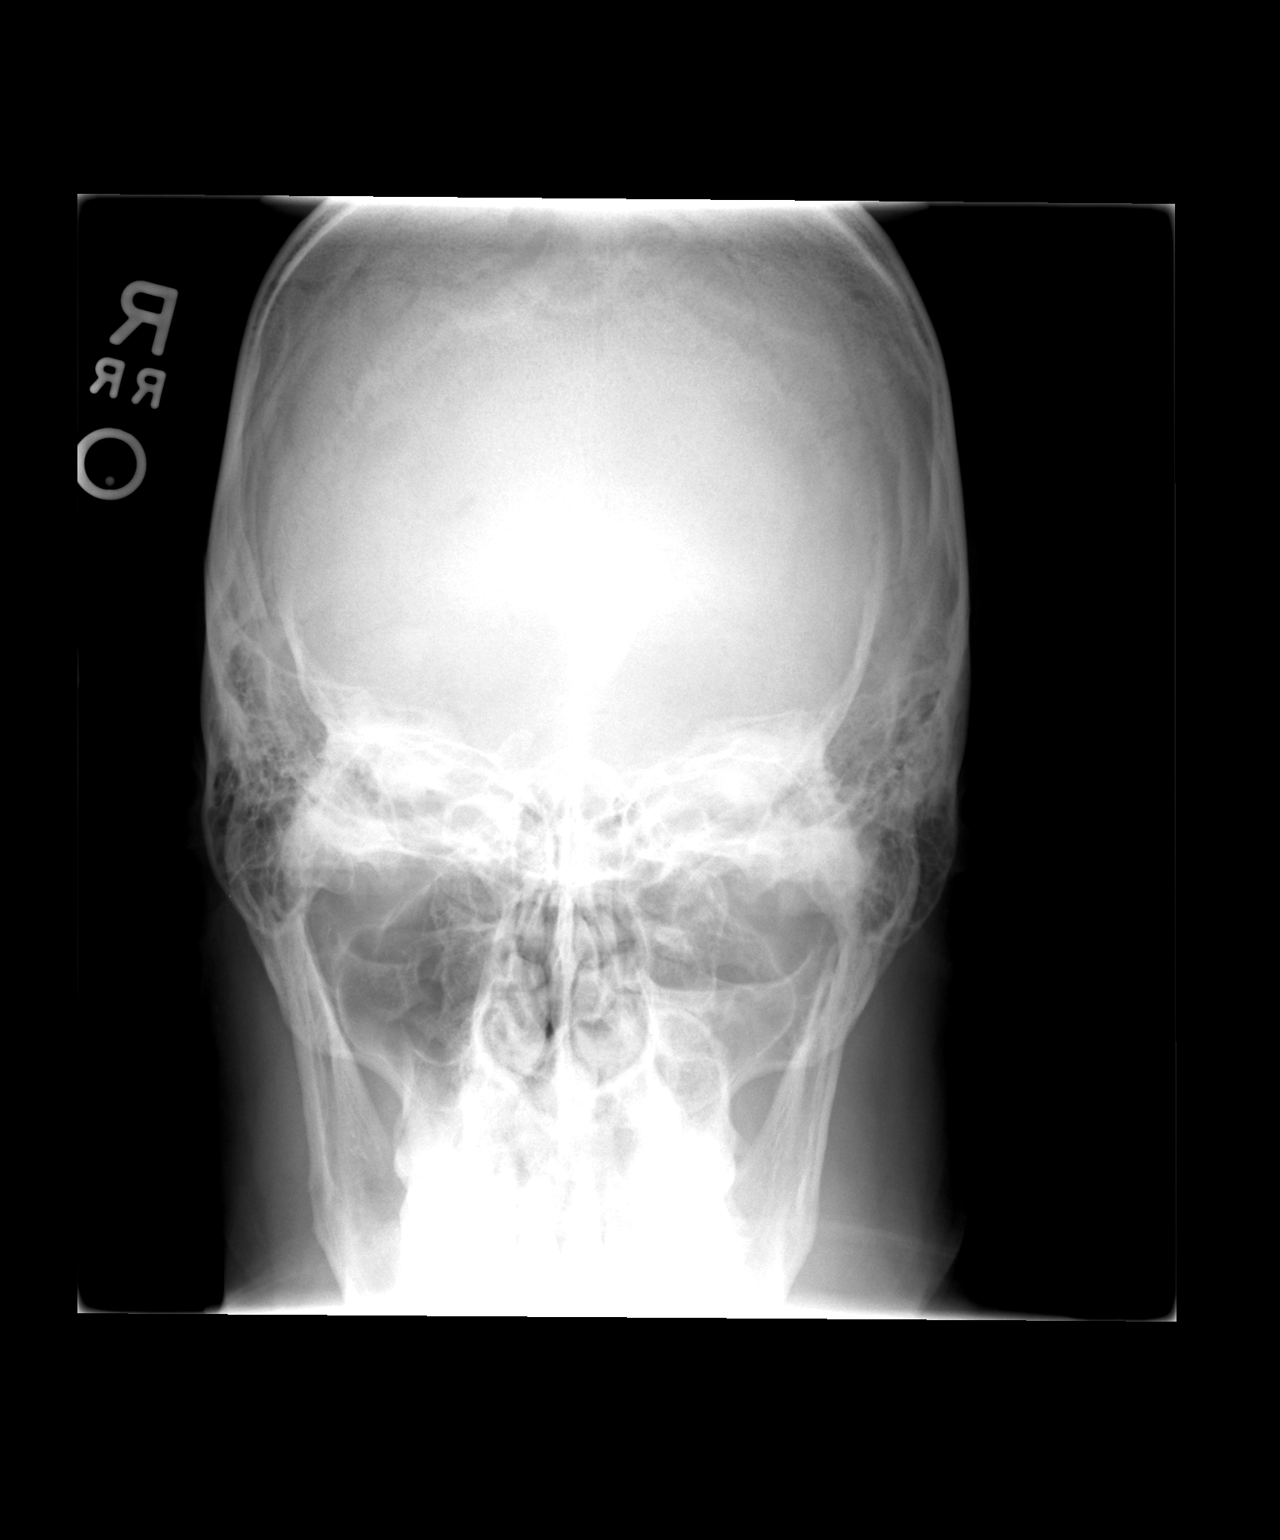

[3 of 3 positions shown; findings below may reference images not displayed]

FINDINGS: There is acute left maxillary sinusitis with air-fluid
levels.  The right maxillary sinus is clear and the frontal and
ethmoid sinuses appear clear.  The sphenoid sinus is clear on the
lateral film.
IMPRESSION: Acute left maxillary sinusitis.

## 2014-05-08 ENCOUNTER — Encounter: Payer: Self-pay | Admitting: Family Medicine

## 2014-05-08 DIAGNOSIS — E785 Hyperlipidemia, unspecified: Secondary | ICD-10-CM | POA: Insufficient documentation

## 2014-05-08 LAB — COMPREHENSIVE METABOLIC PANEL
ALK PHOS: 38 U/L — AB (ref 39–117)
ALT: 26 U/L (ref 0–53)
AST: 22 U/L (ref 0–37)
Albumin: 4.3 g/dL (ref 3.5–5.2)
BUN: 15 mg/dL (ref 6–23)
CO2: 29 meq/L (ref 19–32)
CREATININE: 0.64 mg/dL (ref 0.50–1.35)
Calcium: 9.5 mg/dL (ref 8.4–10.5)
Chloride: 105 mEq/L (ref 96–112)
GLUCOSE: 93 mg/dL (ref 70–99)
Potassium: 4.3 mEq/L (ref 3.5–5.3)
SODIUM: 140 meq/L (ref 135–145)
TOTAL PROTEIN: 7.2 g/dL (ref 6.0–8.3)
Total Bilirubin: 0.8 mg/dL (ref 0.2–1.2)

## 2014-05-08 LAB — CBC
HCT: 46.3 % (ref 39.0–52.0)
HEMOGLOBIN: 15.6 g/dL (ref 13.0–17.0)
MCH: 29.2 pg (ref 26.0–34.0)
MCHC: 33.7 g/dL (ref 30.0–36.0)
MCV: 86.5 fL (ref 78.0–100.0)
MPV: 10.3 fL (ref 8.6–12.4)
Platelets: 182 10*3/uL (ref 150–400)
RBC: 5.35 MIL/uL (ref 4.22–5.81)
RDW: 13.9 % (ref 11.5–15.5)
WBC: 4.9 10*3/uL (ref 4.0–10.5)

## 2014-05-08 LAB — LIPID PANEL
CHOL/HDL RATIO: 4.2 ratio
CHOLESTEROL: 178 mg/dL (ref 0–200)
HDL: 42 mg/dL (ref >=40)
LDL Cholesterol: 118 mg/dL — ABNORMAL HIGH (ref 0–99)
TRIGLYCERIDES: 88 mg/dL (ref <150)
VLDL: 18 mg/dL (ref 0–40)

## 2014-12-09 ENCOUNTER — Ambulatory Visit (INDEPENDENT_AMBULATORY_CARE_PROVIDER_SITE_OTHER): Payer: PRIVATE HEALTH INSURANCE | Admitting: Family Medicine

## 2014-12-09 ENCOUNTER — Encounter: Payer: Self-pay | Admitting: Family Medicine

## 2014-12-09 VITALS — BP 134/56 | HR 88 | Wt 228.0 lb

## 2014-12-09 DIAGNOSIS — J302 Other seasonal allergic rhinitis: Secondary | ICD-10-CM | POA: Diagnosis not present

## 2014-12-09 DIAGNOSIS — K219 Gastro-esophageal reflux disease without esophagitis: Secondary | ICD-10-CM | POA: Diagnosis not present

## 2014-12-09 MED ORDER — FLUTICASONE PROPIONATE 50 MCG/ACT NA SUSP: 2.0000 | Freq: Every day | NASAL | Status: DC

## 2014-12-09 MED ORDER — PANTOPRAZOLE SODIUM 40 MG PO TBEC: 40.0000 mg | DELAYED_RELEASE_TABLET | Freq: Every day | ORAL | Status: DC

## 2014-12-09 NOTE — Progress Notes (Signed)
CC: Kevin LameJeffrey Wayne Bailey is a 44 y.o. male is here for Gastroesophageal Reflux   Subjective: HPI:  Complains of epigastric fullness which is nonradiating. He also experiences an acidic sensation behind the sternum. Symptoms are worse with spicy foods or tomato based sauces. Symptoms are significantly improved with taking what sounds to be Tums. He is also occasionally using Prilosec. Nothing else seems to make better or worse. He's been dealing with this for over 2 years now. Symptoms occur most days of the week. Denies unintentional weight loss, nausea, back pain, diarrhea, constipation or fevers.  He is requesting refill on Flonase. He uses 2 sprays in each nostril on a daily basis during the fall. If he does not use this he will get nasal congestion to a moderate to severe degree that interferes with his quality of life. He currently denies any facial pressure. Pain nor postnasal drip.   Review Of Systems Outlined In HPI  No past medical history on file.  Past Surgical History  Procedure Laterality Date  . Wisdom tooth extraction     Family History  Problem Relation Age of Onset  . Hypertension Father   . Diabetes      grandmother     Social History   Social History  . Marital Status: Married    Spouse Name: N/A  . Number of Children: N/A  . Years of Education: N/A   Occupational History  . Not on file.   Social History Main Topics  . Smoking status: Never Smoker   . Smokeless tobacco: Never Used  . Alcohol Use: 1.2 oz/week    2 Standard drinks or equivalent per week  . Drug Use: No  . Sexual Activity:    Partners: Female   Other Topics Concern  . Not on file   Social History Narrative     Objective: BP 134/56 mmHg  Pulse 88  Wt 228 lb (103.42 kg)  General: Alert and Oriented, No Acute Distress HEENT: Pupils equal, round, reactive to light. Conjunctivae clear.  External ears unremarkable, canals clear with intact TMs with appropriate landmarks.  Middle ear  appears open without effusion. Pink inferior turbinates.  Moist mucous membranes, pharynx without inflammation nor lesions.  Neck supple without palpable lymphadenopathy nor abnormal masses. Lungs: clear and comfortable work of breathing Cardiac: Regular rate and rhythm.  Abdomen: mild obesity nontender Extremities: No peripheral edema.  Strong peripheral pulses.  Mental Status: No depression, anxiety, nor agitation. Skin: Warm and dry.  Assessment & Plan: Kevin Bailey was seen today for gastroesophageal reflux.  Diagnoses and all orders for this visit:  Gastroesophageal reflux disease without esophagitis -     pantoprazole (PROTONIX) 40 MG tablet; Take 1 tablet (40 mg total) by mouth daily.  Seasonal rhinitis -     fluticasone (FLONASE) 50 MCG/ACT nasal spray; Place 2 sprays into both nostrils daily.   GERD: Uncontrolled chronic condition start daily Protonix Seasonal rhinitis: Controlled with Flonase  Return if symptoms worsen or fail to improve.

## 2015-01-11 ENCOUNTER — Encounter (HOSPITAL_BASED_OUTPATIENT_CLINIC_OR_DEPARTMENT_OTHER): Payer: Self-pay | Admitting: Emergency Medicine

## 2015-01-11 ENCOUNTER — Emergency Department (HOSPITAL_BASED_OUTPATIENT_CLINIC_OR_DEPARTMENT_OTHER)
Admission: EM | Admit: 2015-01-11 | Discharge: 2015-01-11 | Disposition: A | Discharge status: Home / Self Care | Payer: PRIVATE HEALTH INSURANCE | Attending: Emergency Medicine | Admitting: Emergency Medicine

## 2015-01-11 DIAGNOSIS — I10 Essential (primary) hypertension: Secondary | ICD-10-CM | POA: Insufficient documentation

## 2015-01-11 DIAGNOSIS — K219 Gastro-esophageal reflux disease without esophagitis: Secondary | ICD-10-CM | POA: Insufficient documentation

## 2015-01-11 DIAGNOSIS — M791 Myalgia: Secondary | ICD-10-CM | POA: Insufficient documentation

## 2015-01-11 DIAGNOSIS — F419 Anxiety disorder, unspecified: Secondary | ICD-10-CM | POA: Insufficient documentation

## 2015-01-11 DIAGNOSIS — Z79899 Other long term (current) drug therapy: Secondary | ICD-10-CM | POA: Insufficient documentation

## 2015-01-11 DIAGNOSIS — R030 Elevated blood-pressure reading, without diagnosis of hypertension: Secondary | ICD-10-CM | POA: Diagnosis present

## 2015-01-11 DIAGNOSIS — Z7951 Long term (current) use of inhaled steroids: Secondary | ICD-10-CM | POA: Diagnosis not present

## 2015-01-11 HISTORY — DX: Gastro-esophageal reflux disease without esophagitis: K21.9

## 2015-01-11 NOTE — ED Notes (Signed)
Patient reports that he took flonase yesterday -- the ptient took BP today and found it to be high. Denies any symptoms

## 2015-01-11 NOTE — ED Notes (Signed)
Pt requested information about what is a blood pressure. Printed off clinical keys guideline on blood pressure and provide to the patient. Additionally spoke with patient and his wife about what a blood pressure is and answered any questions.

## 2015-01-11 NOTE — Discharge Instructions (Signed)
Return to the ED with any concerns including chest pain, difficulty breathing, changes in vision or speech, weakness of arms or legs, fainting, decreased level of alertness/lethargy, or any other alarming symptoms °

## 2015-01-11 NOTE — ED Provider Notes (Signed)
CSN: [id number]     Arrival date & time 01/11/15  2046 History  By signing my name below, I, Gonzella LexKimberly Bianca Gray, attest that this documentation has been prepared under the direction and in the presence of Jerelyn ScottMartha Linker, MD. Electronically Signed: Gonzella LexKimberly Bianca Gray, Scribe. 01/11/2015. 10:40 PM.    Chief Complaint  Patient presents with  . Hypertension     Patient is a 44 y.o. male presenting with hypertension. The history is provided by the patient. No language interpreter was used.  Hypertension This is a new problem. The current episode started yesterday. The problem occurs constantly. The problem has not changed since onset.Nothing aggravates the symptoms. Nothing relieves the symptoms. Treatments tried: aspirin. The treatment provided no relief.    HPI Comments: Kevin Bailey is a 44 y.o. male who presents to the Emergency Department complaining of elevated blood pressure onset today. Pt reports he took Flonase and Claritan yesterday morning to relieve his congestion. Pt also states that he has been feeling anxious lately. Pt notes he had inflammation of his chest cartilage this past February and reports that his pectoral muscles have been burning so he has also taken anti-inflammatory medication as well as 500 mg of aspirin today. He also takes a prescribed anti-reflux pill daily. Pt denies tachycardia.   Past Medical History  Diagnosis Date  . Acid reflux    Past Surgical History  Procedure Laterality Date  . Wisdom tooth extraction     Family History  Problem Relation Age of Onset  . Hypertension Father   . Diabetes      grandmother    Social History  Substance Use Topics  . Smoking status: Never Smoker   . Smokeless tobacco: Never Used  . Alcohol Use: 1.2 oz/week    2 Standard drinks or equivalent per week    Review of Systems  Cardiovascular: Negative for palpitations.       HTN  Musculoskeletal: Positive for myalgias.       Pectoral muscles    Psychiatric/Behavioral:       Anxiety   ROS reviewed and all otherwise negative except for mentioned in HPI    Allergies  Review of patient's allergies indicates no known allergies.  Home Medications   Prior to Admission medications   Medication Sig Start Date End Date Taking? Authorizing Provider  fluticasone (FLONASE) 50 MCG/ACT nasal spray Place 2 sprays into both nostrils daily. 12/09/14   Sean Hommel, DO  pantoprazole (PROTONIX) 40 MG tablet Take 1 tablet (40 mg total) by mouth daily. 12/09/14   Sean Hommel, DO   BP 151/95 mmHg  Pulse 93  Temp(Src) 98.4 F (36.9 C) (Oral)  Resp 18  Ht 5\' 7"  (1.702 m)  Wt 220 lb (99.791 kg)  BMI 34.45 kg/m2  SpO2 100%  Vitals reviewed Physical Exam  Physical Examination: General appearance - alert, well appearing, and in no distress Mental status - alert, oriented to person, place, and time Eyes - pupils equal and reactive, extraocular eye movements intact Mouth - mucous membranes moist, pharynx normal without lesions Chest - clear to auscultation, no wheezes, rales or rhonchi, symmetric air entry Heart - normal rate, regular rhythm, normal S1, S2, no murmurs, rubs, clicks or gallops Abdomen - soft, nontender, nondistended, no masses or organomegaly Neurological - alert, oriented, normal speech, cranial nerves 2-12 tested and intact, strength 5/5 in extremities x 4, sensation intact Extremities - peripheral pulses normal, no pedal edema, no clubbing or cyanosis Skin - normal coloration and  turgor, no rashes  ED Course  Procedures  DIAGNOSTIC STUDIES:    Oxygen Saturation is 99% on RA, normal by my interpretation.   COORDINATION OF CARE:  9:56 PM Discussed treatment plan with pt at bedside and pt agreed to plan.     Labs Review   MDM   Final diagnoses:  Essential hypertension    Pt presenting with concern for hypertension.  He has no signs or symptoms of end organ damage.  Neurologic and complete physical exam are  reassuring.  Advised to f/u with PMD to have blood pressure rechecked.  If remains elevated he will need to be placed on antihypertensives.  Long discussion about warning signs and symtpoms and symptoms that warrant re-eval.  Discharged with strict return precautions.  Pt agreeable with plan.   I personally performed the services described in this documentation, which was scribed in my presence. The recorded information has been reviewed and is accurate.     Jerelyn Scott, MD 01/11/15 [phone]

## 2015-01-12 ENCOUNTER — Ambulatory Visit (INDEPENDENT_AMBULATORY_CARE_PROVIDER_SITE_OTHER): Payer: PRIVATE HEALTH INSURANCE | Admitting: Family Medicine

## 2015-01-12 ENCOUNTER — Encounter: Payer: Self-pay | Admitting: Family Medicine

## 2015-01-12 VITALS — BP 145/92 | HR 101 | Wt 223.0 lb

## 2015-01-12 DIAGNOSIS — M940 Chondrocostal junction syndrome [Tietze]: Secondary | ICD-10-CM

## 2015-01-12 DIAGNOSIS — I10 Essential (primary) hypertension: Secondary | ICD-10-CM

## 2015-01-12 HISTORY — DX: Chondrocostal junction syndrome (tietze): M94.0

## 2015-01-12 MED ORDER — MELOXICAM 15 MG PO TABS: 15.0000 mg | ORAL_TABLET | Freq: Every day | ORAL | Status: DC

## 2015-01-12 NOTE — Patient Instructions (Signed)
Twice a day BP numbers:                  .

## 2015-01-12 NOTE — Progress Notes (Signed)
CC: Kevin Bailey is a 44 y.o. male is here for Hypertension   Subjective: HPI:  Follow-up essential hypertension: His notice multiple readings in the stage I hypertension range over the past week. He noticed that blood pressures are elevated he is anxious. No interventions as of yet. He's never been on blood pressure medication. He denies exertional chest pain. He denies shortness of breath or any motor or sensory disturbances other than a tender sternum that is improved with  meloxicam and worse with pressing on the sternum. He tells me he loves salt and uses it with most meals.   Review Of Systems Outlined In HPI  Past Medical History  Diagnosis Date  . Acid reflux     Past Surgical History  Procedure Laterality Date  . Wisdom tooth extraction     Family History  Problem Relation Age of Onset  . Hypertension Father   . Diabetes      grandmother     Social History   Social History  . Marital Status: Married    Spouse Name: N/A  . Number of Children: N/A  . Years of Education: N/A   Occupational History  . Not on file.   Social History Main Topics  . Smoking status: Never Smoker   . Smokeless tobacco: Never Used  . Alcohol Use: 1.2 oz/week    2 Standard drinks or equivalent per week  . Drug Use: No  . Sexual Activity:    Partners: Female   Other Topics Concern  . Not on file   Social History Narrative     Objective: BP 145/92 mmHg  Pulse 101  Wt 223 lb (101.152 kg)  General: Alert and Oriented, No Acute Distress HEENT: Pupils equal, round, reactive to light. Conjunctivae clear.  Moist mucous membranes Lungs: Clear to auscultation bilaterally, no wheezing/ronchi/rales.  Comfortable work of breathing. Good air movement. Cardiac: Regular rate and rhythm. Normal S1/S2.  No murmurs, rubs, nor gallops.   Chest: Symptoms reproduced with pressing on the sternum Extremities: No peripheral edema.  Strong peripheral pulses.  Mental Status: No depression,  anxiety, nor agitation. Skin: Warm and dry.  Assessment & Plan: Kevin Bailey was seen today for hypertension.  Diagnoses and all orders for this visit:  Essential hypertension  Costochondritis -     meloxicam (MOBIC) 15 MG tablet; Take 1 tablet (15 mg total) by mouth daily.   Essential hypertension: Uncontrolled chronic condition, discussed DASH diet and it sounds like there is room for improvement with respect to reducing his sodium intake. I've asked him to take his blood pressure twice a day and  Keep a diary of this. I've asked him to drop off this for my review in one week.   costochondritis: Continue as needed meloxicam  Return in about 4 weeks (around 02/09/2015).

## 2015-01-13 ENCOUNTER — Emergency Department (HOSPITAL_BASED_OUTPATIENT_CLINIC_OR_DEPARTMENT_OTHER): Payer: PRIVATE HEALTH INSURANCE

## 2015-01-13 ENCOUNTER — Encounter (HOSPITAL_BASED_OUTPATIENT_CLINIC_OR_DEPARTMENT_OTHER): Payer: Self-pay | Admitting: *Deleted

## 2015-01-13 ENCOUNTER — Telehealth: Payer: Self-pay

## 2015-01-13 ENCOUNTER — Emergency Department (HOSPITAL_BASED_OUTPATIENT_CLINIC_OR_DEPARTMENT_OTHER)
Admission: EM | Admit: 2015-01-13 | Discharge: 2015-01-13 | Disposition: A | Discharge status: Home / Self Care | Payer: PRIVATE HEALTH INSURANCE | Attending: Emergency Medicine | Admitting: Emergency Medicine

## 2015-01-13 DIAGNOSIS — F419 Anxiety disorder, unspecified: Secondary | ICD-10-CM | POA: Diagnosis not present

## 2015-01-13 DIAGNOSIS — Z79899 Other long term (current) drug therapy: Secondary | ICD-10-CM | POA: Insufficient documentation

## 2015-01-13 DIAGNOSIS — R0789 Other chest pain: Secondary | ICD-10-CM | POA: Insufficient documentation

## 2015-01-13 DIAGNOSIS — K219 Gastro-esophageal reflux disease without esophagitis: Secondary | ICD-10-CM | POA: Insufficient documentation

## 2015-01-13 DIAGNOSIS — Z791 Long term (current) use of non-steroidal anti-inflammatories (NSAID): Secondary | ICD-10-CM | POA: Diagnosis not present

## 2015-01-13 DIAGNOSIS — R079 Chest pain, unspecified: Secondary | ICD-10-CM | POA: Diagnosis present

## 2015-01-13 LAB — TROPONIN I

## 2015-01-13 MED ORDER — LORAZEPAM 1 MG PO TABS: 1.0000 mg | ORAL_TABLET | Freq: Three times a day (TID) | ORAL | Status: DC | PRN

## 2015-01-13 NOTE — Telephone Encounter (Signed)
Pt advised to go to the ED

## 2015-01-13 NOTE — ED Provider Notes (Signed)
CSN: [id number]     Arrival date & time 01/13/15  [id number] History   First MD Initiated Contact with Patient 01/13/15 [phone]     Chief Complaint  Patient presents with  . Chest Pain     (Consider location/radiation/quality/duration/timing/severity/associated sxs/prior Treatment) Patient is a 44 y.o. male presenting with chest pain. The history is provided by the patient.  Chest Pain Associated symptoms: no abdominal pain, no back pain, no cough, no fever, no headache, no shortness of breath and not vomiting   Patient w recent dx elevated bp, c/o left lateral chest pain onset this AM, at rest. Initially was brief, seconds, then persistent. No pleuritic pain. No associated sob, nv or diaphoresis. No current or recent exertional chest pain or discomfort. Non smoker. No hx dm. No fam hx premature cad, gf had mi in older age.  No recent unusual doe or fatigue. No specific chest wall injury or strain. Takes antiacid on regular basis, ?hx gerd.      Past Medical History  Diagnosis Date  . Acid reflux    Past Surgical History  Procedure Laterality Date  . Wisdom tooth extraction     Family History  Problem Relation Age of Onset  . Hypertension Father   . Diabetes      grandmother    Social History  Substance Use Topics  . Smoking status: Never Smoker   . Smokeless tobacco: Never Used  . Alcohol Use: 1.2 oz/week    2 Standard drinks or equivalent per week    Review of Systems  Constitutional: Negative for fever.  HENT: Negative for sore throat.   Eyes: Negative for redness.  Respiratory: Negative for cough and shortness of breath.   Cardiovascular: Positive for chest pain. Negative for leg swelling.  Gastrointestinal: Negative for vomiting and abdominal pain.  Genitourinary: Negative for flank pain.  Musculoskeletal: Negative for back pain and neck pain.  Skin: Negative for rash.  Neurological: Negative for headaches.  Hematological: Does not bruise/bleed easily.   Psychiatric/Behavioral: Negative for confusion.      Allergies  Flonase  Home Medications   Prior to Admission medications   Medication Sig Start Date End Date Taking? Authorizing Provider  meloxicam (MOBIC) 15 MG tablet Take 1 tablet (15 mg total) by mouth daily. 01/12/15  Yes Sean Hommel, DO  pantoprazole (PROTONIX) 40 MG tablet Take 1 tablet (40 mg total) by mouth daily. 12/09/14  Yes Sean Hommel, DO   BP 136/99 mmHg  Pulse 96  Temp(Src) 98.3 F (36.8 C) (Oral)  Resp 13  Ht 5\' 7"  (1.702 m)  Wt 101.152 kg  BMI 34.92 kg/m2  SpO2 99% Physical Exam  Constitutional: He is oriented to person, place, and time. He appears well-developed and well-nourished. No distress.  HENT:  Mouth/Throat: Oropharynx is clear and moist.  Eyes: Conjunctivae are normal. No scleral icterus.  Neck: Neck supple. No tracheal deviation present.  Cardiovascular: Normal rate, regular rhythm, normal heart sounds and intact distal pulses.  Exam reveals no gallop and no friction rub.   No murmur heard. Pulmonary/Chest: Effort normal and breath sounds normal. No accessory muscle usage. No respiratory distress. He exhibits tenderness.  +chest wall tenderness reproducing symptoms. No sts, no skin changes. No rash/shingles.    Abdominal: Soft. Bowel sounds are normal. He exhibits no distension. There is no tenderness.  Musculoskeletal: Normal range of motion. He exhibits no edema.  Neurological: He is alert and oriented to person, place, and time.  Skin: Skin is warm and dry.  No rash noted. He is not diaphoretic.  Psychiatric:  Mildly anxious.   Nursing note and vitals reviewed.   ED Course  Procedures (including critical care time) Labs Review   Results for orders placed or performed during the hospital encounter of 01/13/15  Troponin I  Result Value Ref Range   Troponin I <0.03 <0.031 ng/mL   Dg Chest 2 View  01/13/2015  CLINICAL DATA:  Chest pain and hypertension EXAM: CHEST  2 VIEW  COMPARISON:  November 19, 2011 FINDINGS: There is no edema or consolidation. The heart size and pulmonary vascularity are normal. No adenopathy. No pneumothorax. No bone lesions. IMPRESSION: No edema or consolidation. Electronically Signed   By: Bretta Bang III M.D.   On: 01/13/2015 10:33       I have personally reviewed and evaluated these images and lab results as part of my medical decision-making.   EKG Interpretation   Date/Time:  Tuesday January 13 2015 09:25:11 EST Ventricular Rate:  96 PR Interval:  116 QRS Duration: 96 QT Interval:  360 QTC Calculation: 454 R Axis:   48 Text Interpretation:  Normal sinus rhythm with sinus arrhythmia No  previous tracing Confirmed by Denton Lank  MD, Caryn Bee ([id number]) on 01/13/2015  9:41:33 AM      MDM   Labs.  Ecg.  Reviewed nursing notes and prior charts for additional history.   Trop neg. pts symptoms atypical, and reproducible on exam.   Feel likely anxiety is contributing to symptoms as well, will give small quantity rx to try prn.   Pt currently appears stable for d/c.      Cathren Laine, MD 01/13/15 1150

## 2015-01-13 NOTE — ED Notes (Signed)
Pt directed to follow-up with PCP. Directed to pharmacy to pick up medications

## 2015-01-13 NOTE — Telephone Encounter (Signed)
Pt c/o burning on the left side of his chest along with tightness of the chest.  Should he go to the ED or do you have any other suggestions?

## 2015-01-13 NOTE — ED Notes (Signed)
Patient transported to xray via stretcher.

## 2015-01-13 NOTE — Discharge Instructions (Signed)
It was our pleasure to provide your ER care today - we hope that you feel better.  If heartburn/reflux, try pepcid and maalox as need for symptom relief.  If chest wall pain/soreness, try motrin or aleve as need.  For anxiety, try relaxation exercises, or other relaxing activity.  You may also try ativan if severe anxiety - no driving if/when taking.   Follow up with primary care doctor in the next 1-2 weeks - have blood pressure rechecked then.  Return to ER if worse, new symptoms, fevers, trouble breathing, medical emergency, other concern.    Chest Wall Pain Chest wall pain is pain in or around the bones and muscles of your chest. Sometimes, an injury causes this pain. Sometimes, the cause may not be known. This pain may take several weeks or longer to get better. HOME CARE INSTRUCTIONS  Pay attention to any changes in your symptoms. Take these actions to help with your pain:   Rest as told by your health care provider.   Avoid activities that cause pain. These include any activities that use your chest muscles or your abdominal and side muscles to lift heavy items.   If directed, apply ice to the painful area:  Put ice in a plastic bag.  Place a towel between your skin and the bag.  Leave the ice on for 20 minutes, 2-3 times per day.  Take over-the-counter and prescription medicines only as told by your health care provider.  Do not use tobacco products, including cigarettes, chewing tobacco, and e-cigarettes. If you need help quitting, ask your health care provider.  Keep all follow-up visits as told by your health care provider. This is important. SEEK MEDICAL CARE IF:  You have a fever.  Your chest pain becomes worse.  You have new symptoms. SEEK IMMEDIATE MEDICAL CARE IF:  You have nausea or vomiting.  You feel sweaty or light-headed.  You have a cough with phlegm (sputum) or you cough up blood.  You develop shortness of breath.   This information is  not intended to replace advice given to you by your health care provider. Make sure you discuss any questions you have with your health care provider.   Document Released: 02/07/2005 Document Revised: 10/29/2014 Document Reviewed: 05/05/2014 Elsevier Interactive Patient Education 2016 [employer] Inc[employer]   Nonspecific Chest Pain  Chest pain can be caused by many different conditions. There is always a chance that your pain could be related to something serious, such as a heart attack or a blood clot in your lungs. Chest pain can also be caused by conditions that are not life-threatening. If you have chest pain, it is very important to follow up with your health care provider. CAUSES  Chest pain can be caused by:  Heartburn.  Pneumonia or bronchitis.  Anxiety or stress.  Inflammation around your heart (pericarditis) or lung (pleuritis or pleurisy).  A blood clot in your lung.  A collapsed lung (pneumothorax). It can develop suddenly on its own (spontaneous pneumothorax) or from trauma to the chest.  Shingles infection (varicella-zoster virus).  Heart attack.  Damage to the bones, muscles, and cartilage that make up your chest wall. This can include:  Bruised bones due to injury.  Strained muscles or cartilage due to frequent or repeated coughing or overwork.  Fracture to one or more ribs.  Sore cartilage due to inflammation (costochondritis). RISK FACTORS  Risk factors for chest pain may include:  Activities that increase your risk for trauma or injury to  your chest.  Respiratory infections or conditions that cause frequent coughing.  Medical conditions or overeating that can cause heartburn.  Heart disease or family history of heart disease.  Conditions or health behaviors that increase your risk of developing a blood clot.  Having had chicken pox (varicella zoster). SIGNS AND SYMPTOMS Chest pain can feel like:  Burning or tingling on the surface of your chest or deep  in your chest.  Crushing, pressure, aching, or squeezing pain.  Dull or sharp pain that is worse when you move, cough, or take a deep breath.  Pain that is also felt in your back, neck, shoulder, or arm, or pain that spreads to any of these areas. Your chest pain may come and go, or it may stay constant. DIAGNOSIS Lab tests or other studies may be needed to find the cause of your pain. Your health care provider may have you take a test called an ambulatory ECG (electrocardiogram). An ECG records your heartbeat patterns at the time the test is performed. You may also have other tests, such as:  Transthoracic echocardiogram (TTE). During echocardiography, sound waves are used to create a picture of all of the heart structures and to look at how blood flows through your heart.  Transesophageal echocardiogram (TEE).This is a more advanced imaging test that obtains images from inside your body. It allows your health care provider to see your heart in finer detail.  Cardiac monitoring. This allows your health care provider to monitor your heart rate and rhythm in real time.  Holter monitor. This is a portable device that records your heartbeat and can help to diagnose abnormal heartbeats. It allows your health care provider to track your heart activity for several days, if needed.  Stress tests. These can be done through exercise or by taking medicine that makes your heart beat more quickly.  Blood tests.  Imaging tests. TREATMENT  Your treatment depends on what is causing your chest pain. Treatment may include:  Medicines. These may include:  Acid blockers for heartburn.  Anti-inflammatory medicine.  Pain medicine for inflammatory conditions.  Antibiotic medicine, if an infection is present.  Medicines to dissolve blood clots.  Medicines to treat coronary artery disease.  Supportive care for conditions that do not require medicines. This may include:  Resting.  Applying heat  or cold packs to injured areas.  Limiting activities until pain decreases. HOME CARE INSTRUCTIONS  If you were prescribed an antibiotic medicine, finish it all even if you start to feel better.  Avoid any activities that bring on chest pain.  Do not use any tobacco products, including cigarettes, chewing tobacco, or electronic cigarettes. If you need help quitting, ask your health care provider.  Do not drink alcohol.  Take medicines only as directed by your health care provider.  Keep all follow-up visits as directed by your health care provider. This is important. This includes any further testing if your chest pain does not go away.  If heartburn is the cause for your chest pain, you may be told to keep your head raised (elevated) while sleeping. This reduces the chance that acid will go from your stomach into your esophagus.  Make lifestyle changes as directed by your health care provider. These may include:  Getting regular exercise. Ask your health care provider to suggest some activities that are safe for you.  Eating a heart-healthy diet. A registered dietitian can help you to learn healthy eating options.  Maintaining a healthy weight.  Managing diabetes,  if necessary.  Reducing stress. SEEK MEDICAL CARE IF:  Your chest pain does not go away after treatment.  You have a rash with blisters on your chest.  You have a fever. SEEK IMMEDIATE MEDICAL CARE IF:   Your chest pain is worse.  You have an increasing cough, or you cough up blood.  You have severe abdominal pain.  You have severe weakness.  You faint.  You have chills.  You have sudden, unexplained chest discomfort.  You have sudden, unexplained discomfort in your arms, back, neck, or jaw.  You have shortness of breath at any time.  You suddenly start to sweat, or your skin gets clammy.  You feel nauseous or you vomit.  You suddenly feel light-headed or dizzy.  Your heart begins to beat  quickly, or it feels like it is skipping beats. These symptoms may represent a serious problem that is an emergency. Do not wait to see if the symptoms will go away. Get medical help right away. Call your local emergency services (911 in the [place] Do not drive yourself to the hospital.   This information is not intended to replace advice given to you by your health care provider. Make sure you discuss any questions you have with your health care provider.   Document Released: 11/17/2004 Document Revised: 02/28/2014 Document Reviewed: 09/13/2013 Elsevier Interactive Patient Education 2016 Elsevier Inc.   Generalized Anxiety Disorder Generalized anxiety disorder (GAD) is a mental disorder. It interferes with life functions, including relationships, work, and school. GAD is different from normal anxiety, which everyone experiences at some point in their lives in response to specific life events and activities. Normal anxiety actually helps [place] prepare for and get through these life events and activities. Normal anxiety goes away after the event or activity is over.  GAD causes anxiety that is not necessarily related to specific events or activities. It also causes excess anxiety in proportion to specific events or activities. The anxiety associated with GAD is also difficult to control. GAD can vary from mild to severe. People with severe GAD can have intense waves of anxiety with physical symptoms (panic attacks).  SYMPTOMS The anxiety and worry associated with GAD are difficult to control. This anxiety and worry are related to many life events and activities and also occur more days than not for 6 months or longer. People with GAD also have three or more of the following symptoms (one or more in children):  Restlessness.   Fatigue.  Difficulty concentrating.   Irritability.  Muscle tension.  Difficulty sleeping or unsatisfying sleep. DIAGNOSIS GAD is diagnosed through an assessment by  your health care provider. Your health care provider will ask you questions aboutyour mood,physical symptoms, and events in your life. Your health care provider may ask you about your medical history and use of alcohol or drugs, including prescription medicines. Your health care provider may also do a physical exam and blood tests. Certain medical conditions and the use of certain substances can cause symptoms similar to those associated with GAD. Your health care provider may refer you to a mental health specialist for further evaluation. TREATMENT The following therapies are usually used to treat GAD:   Medication. Antidepressant medication usually is prescribed for long-term daily control. Antianxiety medicines may be added in severe cases, especially when panic attacks occur.   Talk therapy (psychotherapy). Certain types of talk therapy can be helpful in treating GAD by providing support, education, and guidance. A form of talk therapy called cognitive  behavioral therapy can teach you healthy ways to think about and react to daily life events and activities.  Stress managementtechniques. These include yoga, meditation, and exercise and can be very helpful when they are practiced regularly. A mental health specialist can help determine which treatment is best for you. Some people see improvement with one therapy. However, other people require a combination of therapies.   This information is not intended to replace advice given to you by your health care provider. Make sure you discuss any questions you have with your health care provider.   Document Released: 06/04/2012 Document Revised: 02/28/2014 Document Reviewed: 06/04/2012 Elsevier Interactive Patient Education 2016 [place] and Stress Management Stress is a normal reaction to life events. It is what you feel when life demands more than you are used to or more than you can handle. Some stress can be useful. For  example, the stress reaction can help you catch the last bus of the day, study for a test, or meet a deadline at work. But stress that occurs too often or for too long can cause problems. It can affect your emotional health and interfere with relationships and normal daily activities. Too much stress can weaken your immune system and increase your risk for physical illness. If you already have a medical problem, stress can make it worse. CAUSES  All sorts of life events may cause stress. An event that causes stress for one person may not be stressful for another person. Major life events commonly cause stress. These may be positive or negative. Examples include losing your job, moving into a new home, getting married, having a baby, or losing a loved one. Less obvious life events may also cause stress, especially if they occur day after day or in combination. Examples include working long hours, driving in traffic, caring for children, being in debt, or being in a difficult relationship. SIGNS AND SYMPTOMS Stress may cause emotional symptoms including, the following:  Anxiety. This is feeling worried, afraid, on edge, overwhelmed, or out of control.  Anger. This is feeling irritated or impatient.  Depression. This is feeling sad, down, helpless, or guilty.  Difficulty focusing, remembering, or making decisions. Stress may cause physical symptoms, including the following:   Aches and pains. These may affect your head, neck, back, stomach, or other areas of your body.  Tight muscles or clenched jaw.  Low energy or trouble sleeping. Stress may cause unhealthy behaviors, including the following:   Eating to feel better (overeating) or skipping meals.  Sleeping too little, too much, or both.  Working too much or putting off tasks (procrastination).  Smoking, drinking alcohol, or using drugs to feel better. DIAGNOSIS  Stress is diagnosed through an assessment by your health care provider.  Your health care provider will ask questions about your symptoms and any stressful life events.Your health care provider will also ask about your medical history and may order blood tests or other tests. Certain medical conditions and medicine can cause physical symptoms similar to stress. Mental illness can cause emotional symptoms and unhealthy behaviors similar to stress. Your health care provider may refer you to a mental health professional for further evaluation.  TREATMENT  Stress management is the recommended treatment for stress.The goals of stress management are reducing stressful life events and coping with stress in healthy ways.  Techniques for reducing stressful life events include the following:  Stress identification. Self-monitor for stress and identify what causes stress for you. These  skills may help you to avoid some stressful events.  Time management. Set your priorities, keep a calendar of events, and learn to say "no." These tools can help you avoid making too many commitments. Techniques for coping with stress include the following:  Rethinking the problem. Try to think realistically about stressful events rather than ignoring them or overreacting. Try to find the positives in a stressful situation rather than focusing on the negatives.  Exercise. Physical exercise can release both physical and emotional tension. The key is to find a form of exercise you enjoy and do it regularly.  Relaxation techniques. These relax the body and mind. Examples include yoga, meditation, tai chi, biofeedback, deep breathing, progressive muscle relaxation, listening to music, being out in nature, journaling, and other hobbies. Again, the key is to find one or more that you enjoy and can do regularly.  Healthy lifestyle. Eat a balanced diet, get plenty of sleep, and do not smoke. Avoid using alcohol or drugs to relax.  Strong support network. Spend time with family, friends, or other people  you enjoy being around.Express your feelings and talk things over with someone you trust. Counseling or talktherapy with a mental health professional may be helpful if you are having difficulty managing stress on your own. Medicine is typically not recommended for the treatment of stress.Talk to your health care provider if you think you need medicine for symptoms of stress. HOME CARE INSTRUCTIONS  Keep all follow-up visits as directed by your health care provider.  Take all medicines as directed by your health care provider. SEEK MEDICAL CARE IF:  Your symptoms get worse or you start having new symptoms.  You feel overwhelmed by your problems and can no longer manage them on your own. SEEK IMMEDIATE MEDICAL CARE IF:  You feel like hurting yourself or someone else.   This information is not intended to replace advice given to you by your health care provider. Make sure you discuss any questions you have with your health care provider.   Document Released: 08/03/2000 Document Revised: 02/28/2014 Document Reviewed: 10/02/2012 Elsevier Interactive Patient Education [employer].

## 2015-01-13 NOTE — ED Notes (Signed)
Patient returns to room from xray.

## 2015-01-13 NOTE — ED Notes (Signed)
Chest tightness since waking this am

## 2015-01-13 NOTE — Telephone Encounter (Signed)
Yes this could be sings of a heart attack and warrants ED evaluation.

## 2015-01-23 ENCOUNTER — Telehealth: Payer: Self-pay | Admitting: Family Medicine

## 2015-01-23 DIAGNOSIS — I10 Essential (primary) hypertension: Secondary | ICD-10-CM

## 2015-01-23 MED ORDER — HYDROCHLOROTHIAZIDE 25 MG PO TABS: ORAL_TABLET | ORAL | Status: DC

## 2015-01-23 NOTE — Telephone Encounter (Signed)
Pt.notified

## 2015-01-23 NOTE — Telephone Encounter (Signed)
Will you please thank patient for dropping off his BP numbers.  The diastolic number is running a little high and I'd recommend starting a daily dose of hydrochlorothiazide I [facility] [place].  F/U in one month

## 2015-01-23 NOTE — Telephone Encounter (Signed)
Pt advised.

## 2015-01-26 ENCOUNTER — Telehealth: Payer: Self-pay

## 2015-01-26 MED ORDER — AMLODIPINE BESYLATE 2.5 MG PO TABS: 2.5000 mg | ORAL_TABLET | Freq: Every day | ORAL | Status: DC

## 2015-01-26 NOTE — Telephone Encounter (Signed)
Pt called reporting that the HCTZ that was Rx'd on Friday is causing dizziness.  He was wondering could amlodipine be Rx'd to take the place of HCTZ.

## 2015-01-26 NOTE — Telephone Encounter (Signed)
Pt.notified

## 2015-01-26 NOTE — Telephone Encounter (Signed)
Sure, [facility] [place]

## 2015-01-28 ENCOUNTER — Telehealth: Payer: Self-pay

## 2015-01-28 NOTE — Telephone Encounter (Signed)
Pt reports that he has not began taking new BP meds because the previous BP med made him dehydrated.  Any suggestions how he can stay hydrated?

## 2015-01-28 NOTE — Telephone Encounter (Signed)
Drink enough water until his urine turns clear, then he will be rehydrated.  The new BP medication works differently and does not have the potential to cause any dehydration.

## 2015-01-29 NOTE — Telephone Encounter (Signed)
Pt.notified

## 2015-02-27 ENCOUNTER — Encounter: Payer: Self-pay | Admitting: *Deleted

## 2015-02-27 ENCOUNTER — Emergency Department (INDEPENDENT_AMBULATORY_CARE_PROVIDER_SITE_OTHER)
Admission: EM | Admit: 2015-02-27 | Discharge: 2015-02-27 | Disposition: A | Discharge status: Home / Self Care | Payer: PRIVATE HEALTH INSURANCE | Source: Home / Self Care | Attending: Family Medicine | Admitting: Family Medicine

## 2015-02-27 DIAGNOSIS — J0100 Acute maxillary sinusitis, unspecified: Secondary | ICD-10-CM

## 2015-02-27 MED ORDER — AMOXICILLIN 875 MG PO TABS: 875.0000 mg | ORAL_TABLET | Freq: Two times a day (BID) | ORAL | Status: DC

## 2015-02-27 MED ORDER — PREDNISONE 20 MG PO TABS: 20.0000 mg | ORAL_TABLET | Freq: Two times a day (BID) | ORAL | Status: DC

## 2015-02-27 NOTE — ED Notes (Signed)
Pt c/o 4 weeks of sinus congestion with dizziness. Worse recently now with cough and some chest congestion. Taken Sudafed with relief.

## 2015-02-27 NOTE — Discharge Instructions (Signed)
May add Pseudoephedrine (30mg , one or two every 4 to 6 hours) for sinus congestion.  Get adequate rest.    Also recommend using saline nasal spray several times daily and saline nasal irrigation (AYR is a common brand). Try warm salt water gargles for sore throat.

## 2015-02-27 NOTE — ED Provider Notes (Signed)
CSN: [id number]     Arrival date & time 02/27/15  1305 History   First MD Initiated Contact with Patient 02/27/15 1403     Chief Complaint  Patient presents with  . Sinus Problem  . Dizziness      HPI Comments: About three weeks ago patient developed typical cold-like symptoms including mild sore throat, sinus congestion, headache, fatigue, and cough.  His symptoms have improved except for sinus congestion, and now soreness in his cheeks.  No fevers, chills, and sweats   The history is provided by the patient.    Past Medical History  Diagnosis Date  . Acid reflux    Past Surgical History  Procedure Laterality Date  . Wisdom tooth extraction     Family History  Problem Relation Age of Onset  . Hypertension Father   . Diabetes      grandmother    Social History  Substance Use Topics  . Smoking status: Never Smoker   . Smokeless tobacco: Never Used  . Alcohol Use: 1.2 oz/week    2 Standard drinks or equivalent per week    Review of Systems No sore throat + cough No pleuritic pain No wheezing + nasal congestion + post-nasal drainage + sinus pain/pressure No itchy/red eyes No earache + dizzy No hemoptysis No SOB No fever/chills No nausea No vomiting No abdominal pain No diarrhea No urinary symptoms No skin rash No fatigue No myalgias No headache Used OTC meds without relief  Allergies  Flonase  Home Medications   Prior to Admission medications   Medication Sig Start Date End Date Taking? Authorizing Provider  esomeprazole (NEXIUM) 40 MG capsule Take 40 mg by mouth daily at 12 noon.   Yes Historical Provider, MD  amoxicillin (AMOXIL) 875 MG tablet Take 1 tablet (875 mg total) by mouth 2 (two) times daily. 02/27/15   Lattie Haw, MD  LORazepam (ATIVAN) 1 MG tablet Take 1 tablet (1 mg total) by mouth every 8 (eight) hours as needed for anxiety. 01/13/15   Cathren Laine, MD  predniSONE (DELTASONE) 20 MG tablet Take 1 tablet (20 mg total) by mouth 2 (two)  times daily. Take with food. 02/27/15   Lattie Haw, MD   Meds Ordered and Administered this Visit  Medications - No data to display  BP 142/87 mmHg  Pulse 108  Temp(Src) 98.3 F (36.8 C) (Oral)  Resp 16  Wt 211 lb (95.709 kg)  SpO2 95% No data found.   Physical Exam Nursing notes and Vital Signs reviewed. Appearance:  Patient appears stated age, and in no acute distress Eyes:  Pupils are equal, round, and reactive to light and accomodation.  Extraocular movement is intact.  Conjunctivae are not inflamed  Ears:  Canals normal.  Tympanic membranes normal.  Nose:  Congested turbinates.  Maxillary sinus tenderness is present.  Pharynx:  Normal Neck:  Supple.  No adenopathy Lungs:  Clear to auscultation.  Breath sounds are equal.  Moving air well. Heart:  Regular rate and rhythm without murmurs, rubs, or gallops.  Abdomen:  Nontender without masses or hepatosplenomegaly.  Bowel sounds are present.  No CVA or flank tenderness.  Extremities:  No edema.  Skin:  No rash present.   ED Course  Procedures  None    MDM   1. Acute maxillary sinusitis, recurrence not specified    Begin amoxicillin 875mg  BID, and prednisone burst. May add Pseudoephedrine (30mg , one or two every 4 to 6 hours) for sinus congestion.  Get  adequate rest.    Also recommend using saline nasal spray several times daily and saline nasal irrigation (AYR is a common brand). Try warm salt water gargles for sore throat.  Followup with Family Doctor if not improved in one week.     Lattie HawStephen A Vyla Pint, MD 03/03/15 1534

## 2015-03-10 ENCOUNTER — Emergency Department (HOSPITAL_BASED_OUTPATIENT_CLINIC_OR_DEPARTMENT_OTHER)
Admission: EM | Admit: 2015-03-10 | Discharge: 2015-03-10 | Disposition: A | Discharge status: Home / Self Care | Payer: PRIVATE HEALTH INSURANCE | Attending: Emergency Medicine | Admitting: Emergency Medicine

## 2015-03-10 ENCOUNTER — Emergency Department (HOSPITAL_BASED_OUTPATIENT_CLINIC_OR_DEPARTMENT_OTHER): Payer: PRIVATE HEALTH INSURANCE

## 2015-03-10 ENCOUNTER — Encounter (HOSPITAL_BASED_OUTPATIENT_CLINIC_OR_DEPARTMENT_OTHER): Payer: Self-pay

## 2015-03-10 DIAGNOSIS — R202 Paresthesia of skin: Secondary | ICD-10-CM | POA: Insufficient documentation

## 2015-03-10 DIAGNOSIS — K219 Gastro-esophageal reflux disease without esophagitis: Secondary | ICD-10-CM | POA: Insufficient documentation

## 2015-03-10 DIAGNOSIS — Z792 Long term (current) use of antibiotics: Secondary | ICD-10-CM | POA: Insufficient documentation

## 2015-03-10 DIAGNOSIS — R42 Dizziness and giddiness: Secondary | ICD-10-CM | POA: Insufficient documentation

## 2015-03-10 DIAGNOSIS — R55 Syncope and collapse: Secondary | ICD-10-CM | POA: Diagnosis present

## 2015-03-10 DIAGNOSIS — Z79899 Other long term (current) drug therapy: Secondary | ICD-10-CM | POA: Diagnosis not present

## 2015-03-10 DIAGNOSIS — Z7952 Long term (current) use of systemic steroids: Secondary | ICD-10-CM | POA: Diagnosis not present

## 2015-03-10 LAB — BASIC METABOLIC PANEL WITH GFR
Anion gap: 6 (ref 5–15)
BUN: 18 mg/dL (ref 6–20)
CO2: 25 mmol/L (ref 22–32)
Calcium: 9.2 mg/dL (ref 8.9–10.3)
Chloride: 109 mmol/L (ref 101–111)
Creatinine, Ser: 0.72 mg/dL (ref 0.61–1.24)
GFR calc Af Amer: >60 mL/min (ref >60)
GFR calc non Af Amer: >60 mL/min (ref >60)
Glucose, Bld: 100 mg/dL — ABNORMAL HIGH (ref 65–99)
Potassium: 4 mmol/L (ref 3.5–5.1)
Sodium: 140 mmol/L (ref 135–145)

## 2015-03-10 LAB — CBC WITH DIFFERENTIAL/PLATELET
BASOS PCT: 1 %
Basophils Absolute: 0.1 10*3/uL (ref 0.0–0.1)
EOS ABS: 0.1 10*3/uL (ref 0.0–0.7)
Eosinophils Relative: 2 %
HEMATOCRIT: 44.7 % (ref 39.0–52.0)
Hemoglobin: 15.2 g/dL (ref 13.0–17.0)
Lymphocytes Relative: 17 %
Lymphs Abs: 0.9 10*3/uL (ref 0.7–4.0)
MCH: 28.6 pg (ref 26.0–34.0)
MCHC: 34 g/dL (ref 30.0–36.0)
MCV: 84 fL (ref 78.0–100.0)
MONO ABS: 0.4 10*3/uL (ref 0.1–1.0)
MONOS PCT: 8 %
Neutro Abs: 4.2 10*3/uL (ref 1.7–7.7)
Neutrophils Relative %: 72 %
Platelets: 162 10*3/uL (ref 150–400)
RBC: 5.32 MIL/uL (ref 4.22–5.81)
RDW: 13.3 % (ref 11.5–15.5)
WBC: 5.7 10*3/uL (ref 4.0–10.5)

## 2015-03-10 LAB — TROPONIN I: Troponin I: <0.03 ng/mL (ref <0.031)

## 2015-03-10 NOTE — Discharge Instructions (Signed)
Paresthesia Paresthesia is an abnormal burning or prickling sensation. This sensation is generally felt in the hands, arms, legs, or feet. However, it may occur in any part of the body. Usually, it is not painful. The feeling may be described as:  Tingling or numbness.  Pins and needles.  Skin crawling.  Buzzing.  Limbs falling asleep.  Itching. Most people experience temporary (transient) paresthesia at some time in their lives. Paresthesia may occur when you breathe too quickly (hyperventilation). It can also occur without any apparent cause. Commonly, paresthesia occurs when pressure is placed on a nerve. The sensation quickly goes away after the pressure is removed. For some people, however, paresthesia is a long-lasting (chronic) condition that is caused by an underlying disorder. If you continue to have paresthesia, you may need further medical evaluation. HOME CARE INSTRUCTIONS Watch your condition for any changes. Taking the following actions may help to lessen any discomfort that you are feeling:  Avoid drinking alcohol.  Try acupuncture or massage to help relieve your symptoms.  Keep all follow-up visits as directed by your health care provider. This is important. SEEK MEDICAL CARE IF:  You continue to have episodes of paresthesia.  Your burning or prickling feeling gets worse when you walk.  You have pain, cramps, or dizziness.  You develop a rash. SEEK IMMEDIATE MEDICAL CARE IF:  You feel weak.  You have trouble walking or moving.  You have problems with speech, understanding, or vision.  You feel confused.  You cannot control your bladder or bowel movements.  You have numbness after an injury.  You faint.   This information is not intended to replace advice given to you by your health care provider. Make sure you discuss any questions you have with your health care provider.   Document Released: 01/28/2002 Document Revised: 06/24/2014 Document Reviewed:  02/03/2014 Elsevier Interactive Patient Education 2016 [employer]  

## 2015-03-10 NOTE — ED Provider Notes (Signed)
CSN: [id number]     Arrival date & time 03/10/15  4098 History   First MD Initiated Contact with Patient 03/10/15 0555     Chief Complaint  Patient presents with  . Near Syncope     (Consider location/radiation/quality/duration/timing/severity/associated sxs/prior Treatment) Patient is a 45 y.o. male presenting with dizziness. The history is provided by the patient.  Dizziness Quality:  Lightheadedness Severity:  Moderate Onset quality:  Gradual Duration:  3 days Timing:  Constant Progression:  Unchanged Chronicity:  Recurrent Context: not when bending over, not with loss of consciousness and not when urinating   Relieved by:  Nothing Worsened by:  Nothing Ineffective treatments:  None tried Associated symptoms: no chest pain, no headaches, no nausea, no palpitations, no shortness of breath, no syncope, no tinnitus, no vision changes, no vomiting and no weakness   Risk factors: no anemia, no heart disease, no hx of stroke, no hx of vertigo and no Meniere's disease   No DOE or SOB no leg pain or swelling of the lower extremities.  No long car trips.  No surgeries.  Tingling all over   Past Medical History  Diagnosis Date  . Acid reflux    Past Surgical History  Procedure Laterality Date  . Wisdom tooth extraction     Family History  Problem Relation Age of Onset  . Hypertension Father   . Diabetes      grandmother    Social History  Substance Use Topics  . Smoking status: Never Smoker   . Smokeless tobacco: Never Used  . Alcohol Use: 1.2 oz/week    2 Standard drinks or equivalent per week    Review of Systems  Constitutional: Negative for fever.  HENT: Negative for sinus pressure, tinnitus and voice change.   Respiratory: Negative for shortness of breath.   Cardiovascular: Negative for chest pain, palpitations, leg swelling and syncope.  Gastrointestinal: Negative for nausea and vomiting.  Neurological: Positive for dizziness and light-headedness. Negative for  syncope, facial asymmetry, speech difficulty, weakness and headaches.  All other systems reviewed and are negative.     Allergies  Flonase  Home Medications   Prior to Admission medications   Medication Sig Start Date End Date Taking? Authorizing Provider  esomeprazole (NEXIUM) 40 MG capsule Take 40 mg by mouth daily at 12 noon.   Yes Historical Provider, MD  amoxicillin (AMOXIL) 875 MG tablet Take 1 tablet (875 mg total) by mouth 2 (two) times daily. 02/27/15   Lattie Haw, MD  LORazepam (ATIVAN) 1 MG tablet Take 1 tablet (1 mg total) by mouth every 8 (eight) hours as needed for anxiety. 01/13/15   Cathren Laine, MD  predniSONE (DELTASONE) 20 MG tablet Take 1 tablet (20 mg total) by mouth 2 (two) times daily. Take with food. 02/27/15   Lattie Haw, MD   BP 154/86 mmHg  Pulse 88  Temp(Src) 98.2 F (36.8 C) (Oral)  Resp 20  Ht  (1.702 m)  Wt 204 lb (92.534 kg)  BMI 31.94 kg/m2  SpO2 96% Physical Exam  Constitutional: He is oriented to person, place, and time. He appears well-developed and well-nourished. No distress.  HENT:  Head: Normocephalic and atraumatic.  Mouth/Throat: Oropharynx is clear and moist.  Eyes: Conjunctivae are normal. Pupils are equal, round, and reactive to light.  Neck: Normal range of motion. Neck supple.  Cardiovascular: Normal rate, regular rhythm and normal heart sounds.   Pulmonary/Chest: Effort normal and breath sounds normal. He has no wheezes. He  has no rales.  Abdominal: Soft. Bowel sounds are normal. There is no tenderness. There is no rebound and no guarding.  Musculoskeletal: Normal range of motion.  Neurological: He is alert and oriented to person, place, and time. He has normal reflexes. He displays normal reflexes. No cranial nerve deficit. He exhibits normal muscle tone. Coordination normal.  Skin: Skin is warm and dry.  Psychiatric: He has a normal mood and affect.    ED Course  Procedures (including critical care time) Labs  Review Labs Reviewed  BASIC METABOLIC PANEL - Abnormal; Notable for the following:    Glucose, Bld 100 (*)    All other components within normal limits  CBC WITH DIFFERENTIAL/PLATELET  TROPONIN I    Imaging Review Ct Head Wo Contrast  03/10/2015  CLINICAL DATA:  45 year old male with dizziness and bilateral leg weakness EXAM: CT HEAD WITHOUT CONTRAST TECHNIQUE: Contiguous axial images were obtained from the base of the skull through the vertex without intravenous contrast. COMPARISON:  None. FINDINGS: The ventricles and sulci are appropriate in size for the patient's age. There is no intracranial hemorrhage. No mass effect or midline shift identified. The gray-white matter differentiation is preserved. There is no extra-axial fluid collection. The visualized paranasal sinuses and mastoid air cells are well aerated. The calvarium is intact. IMPRESSION: No acute intracranial pathology. Electronically Signed   By: Elgie Collard M.D.   On: 03/10/2015 06:48   I have personally reviewed and evaluated these images and lab results as part of my medical decision-making.   EKG Interpretation None      MDM   Final diagnoses:  Paresthesias    Results for orders placed or performed during the hospital encounter of 03/10/15  CBC with Differential/Platelet  Result Value Ref Range   WBC 5.7 4.0 - 10.5 K/uL   RBC 5.32 4.22 - 5.81 MIL/uL   Hemoglobin 15.2 13.0 - 17.0 g/dL   HCT 16.1 09.6 - 04.5 %   MCV 84.0 78.0 - 100.0 fL   MCH 28.6 26.0 - 34.0 pg   MCHC 34.0 30.0 - 36.0 g/dL   RDW 40.9 81.1 - 91.4 %   Platelets 162 150 - 400 K/uL   Neutrophils Relative % 72 %   Neutro Abs 4.2 1.7 - 7.7 K/uL   Lymphocytes Relative 17 %   Lymphs Abs 0.9 0.7 - 4.0 K/uL   Monocytes Relative 8 %   Monocytes Absolute 0.4 0.1 - 1.0 K/uL   Eosinophils Relative 2 %   Eosinophils Absolute 0.1 0.0 - 0.7 K/uL   Basophils Relative 1 %   Basophils Absolute 0.1 0.0 - 0.1 K/uL  Basic metabolic panel  Result Value  Ref Range   Sodium 140 135 - 145 mmol/L   Potassium 4.0 3.5 - 5.1 mmol/L   Chloride 109 101 - 111 mmol/L   CO2 25 22 - 32 mmol/L   Glucose, Bld 100 (H) 65 - 99 mg/dL   BUN 18 6 - 20 mg/dL   Creatinine, Ser 7.82 0.61 - 1.24 mg/dL   Calcium 9.2 8.9 - 95.6 mg/dL   GFR calc non Af Amer >60 >60 mL/min   GFR calc Af Amer >60 >60 mL/min   Anion gap 6 5 - 15  Troponin I  Result Value Ref Range   Troponin I <0.03 <0.031 ng/mL   Ct Head Wo Contrast  03/10/2015  CLINICAL DATA:  45 year old male with dizziness and bilateral leg weakness EXAM: CT HEAD WITHOUT CONTRAST TECHNIQUE: Contiguous axial images were  obtained from the base of the skull through the vertex without intravenous contrast. COMPARISON:  None. FINDINGS: The ventricles and sulci are appropriate in size for the patient's age. There is no intracranial hemorrhage. No mass effect or midline shift identified. The gray-white matter differentiation is preserved. There is no extra-axial fluid collection. The visualized paranasal sinuses and mastoid air cells are well aerated. The calvarium is intact. IMPRESSION: No acute intracranial pathology. Electronically Signed   By: Elgie Collard M.D.   On: 03/10/2015 06:48   Patient thinks symptoms are due to hypoglycemia, disproven on labs and anemia disproven on exam and labs.    Given time course of symptoms, with a negative head CT and normal neurologic exam CVA is excluded.  The symptoms are whole body and favor anxiety.  Follow up with your PMD for ongoing care.  If symptoms of muscle "jumpiness" continue follow up with neurology for outpatient EMG    Jayana Kotula, MD 03/10/15 [phone]

## 2015-03-10 NOTE — ED Notes (Signed)
Pt verbalizes understanding of d/c instructions and denies any further needs at this time. 

## 2015-03-10 NOTE — ED Notes (Addendum)
Pt c/o "weakness," hot sweats and tingling in bilateral face for the last several days.  States yesterday he felt "my sugar drop" and felt "like I was going to pass out," then he ate something and felt better.  Pt states symptoms are worse in the morning.  Pt has an appointment later this week with PCP

## 2015-03-23 ENCOUNTER — Ambulatory Visit (INDEPENDENT_AMBULATORY_CARE_PROVIDER_SITE_OTHER): Payer: PRIVATE HEALTH INSURANCE | Admitting: Neurology

## 2015-03-23 ENCOUNTER — Encounter: Payer: Self-pay | Admitting: Neurology

## 2015-03-23 VITALS — BP 133/83 | HR 98 | Ht 67.0 in | Wt 212.4 lb

## 2015-03-23 DIAGNOSIS — S29091A Other injury of muscle and tendon of front wall of thorax, initial encounter: Secondary | ICD-10-CM

## 2015-03-23 DIAGNOSIS — M542 Cervicalgia: Secondary | ICD-10-CM | POA: Diagnosis not present

## 2015-03-23 DIAGNOSIS — S29011A Strain of muscle and tendon of front wall of thorax, initial encounter: Secondary | ICD-10-CM

## 2015-03-23 MED ORDER — CYCLOBENZAPRINE HCL 10 MG PO TABS: 10.0000 mg | ORAL_TABLET | Freq: Three times a day (TID) | ORAL | Status: DC | PRN

## 2015-03-23 MED ORDER — DICLOFENAC SODIUM 1 % TD GEL
2.0000 g | Freq: Four times a day (QID) | TRANSDERMAL | Status: DC
Start: 2015-03-23 — End: 2015-05-20

## 2015-03-23 NOTE — Progress Notes (Signed)
[facility]    Provider:  Dr Lucia Gaskins Referring Provider: Laren Boom, DO Primary Care Physician:  Ethel Rana  CC:  Muscles jumping, muscle pain  HPI:  Kevin Bailey is a 45 y.o. male here as a referral from Dr. Ivan Anchors for muscle jumping, muscle pain. He has no significant past medical history, gastroesophageal reflux and allergic rhinitis. He was Dxed with Costochondritis a year ago after using a snow blower too much with resultant pain in the chest and pectoral muscles.At the same time he standing working on a laptop with his neck down and he started having soreness in the neck and back, he has tense muscles. His chest is sore. He has some feelings of muscle twitching in the pectoral muscle areas. The twitching started int he pecs when he was using the snow blower last year. He has significant tightness in his cervical muscles. Heat helps. Massage helps. It is very tense and very tight in the neck with decreased ROM of the head. Heat patches help. No current numbness or tingling or weakness. He did wake up one morning feeling weak in the arms and legs and went to the emergency room but he denies any numbness, tingling and burning. At the time he had the generalized weakness and paresthesias, he had cold symptoms, mild sore throat, sinus congestion and headache that morning when he felt weak and was on antibiotics and prednisone for a sinus infection. In the ED that day he did have some tingling in the arms that resolved, eating something made him feel better. Heating and massage make his muscle symptoms better. No current paresthesias or weakness.   Reviewed notes, labs and imaging from outside physicians, which showed: Patient was seen at [facility] on January 17 for weakness, sweating, dizziness and tingling in both arms in the setting of cold symptoms, sore throat, congestion being treated with Sudafed, antibiotics and prednisone. Symptoms have resolved  since then. Hemoglobin A1c 5.1, TSH 1.01 and normal limits. This past November was diagnosed with costochondritis and he took an anti-inflammatory and found that his blood pressure was 165/100, he was started on amlodipine and hydrochlorothiazide and got very dizzy and felt dehydrated. He was taken off his blood pressure meds with monitoring every couple of weeks, advised to avoid NSAIDs, Sudafed, working on weight loss and regular exercise.    CT of the head 03/10/2015 showed no acute intracranial abnormalities including mass lesion or mass effect, hydrocephalus, extra-axial fluid collection, midline shift, hemorrhage, or acute infarction, large ischemic events (personally reviewed images)    Review of Systems: Patient complains of symptoms per HPI as well as the following symptoms: No CP, no SOB. Pertinent negatives per HPI. All others negative.   Social History   Social History  . Marital Status: Married    Spouse Name: [name]  . Number of Children: N/A  . Years of Education: 12   Occupational History  . [employer]    Social History Main Topics  . Smoking status: Never Smoker   . Smokeless tobacco: Never Used  . Alcohol Use: 1.2 oz/week    2 Standard drinks or equivalent per week  . Drug Use: No  . Sexual Activity:    Partners: Female   Other Topics Concern  . Not on file   Social History Narrative   Lives with wife, [name]   Caffeine use:  A little coffee/soda/tea    Family History  Problem Relation Age of Onset  . Hypertension  Father   . Anemia Father   . Diabetes      grandmother   . Diabetes Paternal Grandmother   . Heart disease Paternal Grandfather   . Neuropathy Neg Hx   . Neurofibromatosis Neg Hx   . Multiple sclerosis Neg Hx     Past Medical History  Diagnosis Date  . Acid reflux     Past Surgical History  Procedure Laterality Date  . Wisdom tooth extraction      Current Outpatient Prescriptions  Medication Sig Dispense Refill  .  aspirin 81 MG tablet Take 81 mg by mouth daily.    . cyclobenzaprine (FLEXERIL) 10 MG tablet Take 1 tablet (10 mg total) by mouth 3 (three) times daily as needed for muscle spasms. 90 tablet 3  . diclofenac sodium (VOLTAREN) 1 % GEL Apply 2 g topically 4 (four) times daily. 100 g 11   No current facility-administered medications for this visit.    Allergies as of 03/23/2015 - Review Complete 03/10/2015  Allergen Reaction Noted  . Flonase [fluticasone propionate]  01/12/2015    Vitals: BP 133/83 mmHg  Pulse 98  Ht  (1.702 m)  Wt 212 lb 6.4 oz (96.344 kg)  BMI 33.26 kg/m2 Last Weight:  Wt Readings from Last 1 Encounters:  03/23/15 212 lb 6.4 oz (96.344 kg)   Last Height:   Ht Readings from Last 1 Encounters:  03/23/15  (1.702 m)    Physical exam: Exam: Gen: NAD, conversant, well nourised, obese, well groomed                     CV: RRR, no MRG. No Carotid Bruits. No peripheral edema, warm, nontender Eyes: Conjunctivae clear without exudates or hemorrhage MSK: tightness in the cervical muscles, feels good with palpation.   Neuro: Detailed Neurologic Exam  Speech:    Speech is normal; fluent and spontaneous with normal comprehension.  Cognition:    The patient is oriented to person, place, and time;     recent and remote memory intact;     language fluent;     normal attention, concentration,     fund of knowledge Cranial Nerves:    The pupils are equal, round, and reactive to light. The fundi are normal and spontaneous venous pulsations are present. Visual fields are full to finger confrontation. Extraocular movements are intact. Trigeminal sensation is intact and the muscles of mastication are normal. The face is symmetric. The palate elevates in the midline. Hearing intact. Voice is normal. Shoulder shrug is normal. The tongue has normal motion without fasciculations.   Coordination:    Normal finger to nose and heel to shin. Normal rapid alternating  movements.   Gait:    Heel-toe and tandem gait are normal.   Motor Observation:    No asymmetry, no atrophy, and no involuntary movements noted. Tone:    Normal muscle tone.    Posture:    Posture is normal. normal erect    Strength:    Strength is V/V in the upper and lower limbs.      Sensation: intact to LT     Reflex Exam:  DTR's:    Deep tendon reflexes in the upper and lower extremities are normal bilaterally.   Toes:    The toes are downgoing bilaterally.   Clonus:    Clonus is absent.      Assessment/Plan:  45 year old male with complaints of muscle tightness in the cervical area and pectoral muscle twitching  after muscle strain he sustained last year when using a snowblower. Neurologic exam was unremarkable. Pelvic this is musculoskeletal neck pain pectoral muscle strain. Discussed the management is usually supportive with heat, cold, massage, muscle relaxers, topical medications, weight loss and exercise, stretching and will refer to physical therapy. We'll also try some Flexeril, advised to watch for dizziness, sedation, drowsiness, fatigue, headache, nausea, serious side effects can include users, cardiac conduction disturbance, arrhythmias and others. Stop for anything concerning. Do not drive until you know how this medication affects you. We'll check a CK.  Musculoskeletal neck pain Pectoral muscle strain Referral to PT Flexeril Voltaren Gel topical F/u with your primary care  CC: [facility]  Naomie Dean, MD  [facility] [address] [place], [place] [address]  Phone [phone] Fax [phone]

## 2015-03-23 NOTE — Patient Instructions (Signed)
Remember to drink plenty of fluid, eat healthy meals and do not skip any meals. Try to eat protein with a every meal and eat a healthy snack such as fruit or nuts in between meals. Try to keep a regular sleep-wake schedule and try to exercise daily, particularly in the form of walking, 20-30 minutes a day, if you can.   As far as your medications are concerned, I would like to suggest: Flexeril  up to three times daily  As far as diagnostic testing: Lab, Physical Therapy  I would like to see you back if needed, sooner if we need to. Please call us with any interim questions, concerns, problems, updates or refill requests.   Our phone number is [phone]. We also have an after hours call service for urgent matters and there is a physician on-call for urgent questions. For any emergencies you know to call 911 or go to the nearest emergency room

## 2015-03-24 LAB — CK: Total CK: 43 U/L (ref 24–204)

## 2015-03-25 ENCOUNTER — Telehealth: Payer: Self-pay | Admitting: *Deleted

## 2015-03-25 NOTE — Telephone Encounter (Signed)
Called pt and relayed muscle enzyme test normal per Dr Lucia Gaskins. Pt verbalized understanding.

## 2015-03-25 NOTE — Telephone Encounter (Signed)
-----   Message from Anson Fret, MD sent at 03/24/2015  4:49 PM EST ----- Muscle enzyme test was normal(a test to evaluate for muscle breakdown)  thanks

## 2015-03-30 ENCOUNTER — Telehealth: Payer: Self-pay | Admitting: Neurology

## 2015-03-30 NOTE — Telephone Encounter (Signed)
Pt referred to [facility] outpatient neurorehab. Address: [address] [place], [place] [address] Phone: [phone]

## 2015-03-30 NOTE — Telephone Encounter (Signed)
Patient was advised to call our office today if he hasn't heard anything from Outpatient Rehab regarding referral, patient states he hasn't heard anything yet.

## 2015-03-30 NOTE — Telephone Encounter (Signed)
Called pt and relayed information below. He is going to call and schedule for PT.

## 2015-04-07 ENCOUNTER — Ambulatory Visit: Payer: PRIVATE HEALTH INSURANCE | Attending: Neurology | Admitting: Physical Therapy

## 2015-04-07 DIAGNOSIS — M546 Pain in thoracic spine: Secondary | ICD-10-CM | POA: Diagnosis present

## 2015-04-07 DIAGNOSIS — M542 Cervicalgia: Secondary | ICD-10-CM

## 2015-04-07 DIAGNOSIS — M6281 Muscle weakness (generalized): Secondary | ICD-10-CM | POA: Diagnosis present

## 2015-04-07 DIAGNOSIS — R29898 Other symptoms and signs involving the musculoskeletal system: Secondary | ICD-10-CM | POA: Insufficient documentation

## 2015-04-07 NOTE — Patient Instructions (Signed)
AROM: Lateral Neck Flexion    Slowly tilt head toward one shoulder, then the other. Hold each position _5__ seconds. Repeat __10__ times per set. Do __1__ sets per session. Do _2___ sessions per day.  http://orth.exer.us/296   Copyright  VHI. All rights reserved.  AROM: Neck Rotation   Repeat _10___ times per set. Do __1__ sets per session. Do _2___ sessions per day.  Use towel - crisscross hands and pull across jaw line  http://orth.exer.us/294   Copyright  VHI. All rights reserved.  Strengthening: Isometric Horizontal Adduction    Using door frame for resistance, press right inner arm into ball using light pressure. Keep upper arm parallel to floor, elbow bent 90. Hold _5___ seconds. Repeat ___10_ times per set. Do _3___ sets per session. Do __2__ sessions per day.  http://orth.exer.us/822   Copyright  VHI. All rights reserved.  Functional Pattern Strengthening: Forehand    Hold tubing with right hand behind and out. Pull hand forward while pushing arm out as in tennis forehand. Repeat __10__ times per set. Do _1-2___ sets per session. Do _2___ sessions per day.  http://orth.exer.us/848   Copyright  VHI. All rights reserved.

## 2015-04-08 ENCOUNTER — Encounter: Payer: Self-pay | Admitting: Physical Therapy

## 2015-04-08 NOTE — Therapy (Signed)
[facility] [address] [place], [place], [address] Phone: [phone]   Fax:  [phone]  Physical Therapy Evaluation  Patient Details  Name: Kevin Bailey MRN: [id number] Date of Birth: [birth year] Referring Provider: Dr. Lucia Gaskins  Encounter Date: 04/07/2015      PT End of Session - 04/08/15 2131    Visit Number 1   Number of Visits 9   Date for PT Re-Evaluation 05/06/15   Authorization Type [employer]   PT Start Time 1537   PT Stop Time 1628   PT Time Calculation (min) 51 min      Past Medical History  Diagnosis Date  . Acid reflux     Past Surgical History  Procedure Laterality Date  . Wisdom tooth extraction      There were no vitals filed for this visit.  Visit Diagnosis:  Musculoskeletal neck pain - Plan: PT plan of care cert/re-cert  Right-sided thoracic back pain - Plan: PT plan of care cert/re-cert  Decreased range of motion of neck - Plan: PT plan of care cert/re-cert  Decreased muscle strength - Plan: PT plan of care cert/re-cert      Subjective Assessment - 04/08/15 2152    Subjective Pt reports pain in R side of neck and in R middle back; R trigger point in upper trap produces dizziness and nausea at times; Pt states he developed costochondritis about a year ago from overuse of using a snowblower; states that this has gotten much but now he has neck and middle back 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 Lucia Gaskins   Onset Date/Surgical Date --  Dec. 2016   Precautions    Precautions None   Balance Screen   Has the patient fallen in the past 6 months No   Has the patient had a decrease in activity level because of a fear of falling?  No   Is the patient reluctant to leave their home because of a fear of falling?  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 Trigger points  Pt stated he felt significantly better with much less pain after treatment of E-stim                    PT Education - 04/08/15 2129    Education provided Yes   Education Details recommend use of moist heat rather than electric heating pad; HEP for cervical ROM and pectoralis strengthening   Person(s) Educated Patient   Methods Explanation;Demonstration;Handout   Comprehension Verbalized understanding;Returned demonstration          PT Short Term Goals - 04/08/15 2141    PT SHORT TERM GOAL #1   Title see LTG's           PT Long Term Goals - 04/08/15 2142    PT LONG TERM GOAL #1   Title Report pain in R posterior neck and mid back to be </= 2/10 for icnr. ease with work activities.  (05-06-15)   Baseline 6/10 intensity reported in neck and back   Time 4   Period Weeks   Status New   PT LONG TERM GOAL #2   Title Pt will report at least 25% improvement in ability to carry items to demo incr. pectoralis  strength. (05-06-15)   Time 4   Period Weeks   Status New   PT LONG TERM GOAL #3   Title Increase cervical L lateral flexion and rotation to WNL's for incr. ease with driving. (1-61-09)   Baseline L rotation 46 degrees; lateral flexion 24 degrees   Time 4   Period Weeks   Status New   PT LONG TERM GOAL #4   Title Independent in HEP for cervical ROM and pectoralis strengthening. (05-06-15)   Time 4   Period Weeks   Status New               Plan - 04/08/15 2133    Clinical Impression Statement Pt is a 45 year old male with a 2 - 2 1/2 month history of R neck and mid bakc pain, which pt states started after he began a desk job and was constantly looking down at his laptop with his head held in cervical flexion.  Pt presents with trigger points in R  upper trap in posterior region of neck and a trigger point in middle trap on R side.  Past medical hx includes HTN and costochondritis which occurred about 1 year ago after excessive and overuse of a snowblower.  Pt will benefit from skilled therapeutic intervention in order to improve on the following deficits Pain;Decreased range of motion;Decreased strength;Increased muscle spasms   Rehab Potential Good   PT Frequency 2x / week   PT Duration 4 weeks   PT Treatment/Interventions ADLs/Self Care Home Management;Cryotherapy;Electrical Stimulation;Moist Heat;Ultrasound;Therapeutic exercise;Therapeutic activities;Neuromuscular re-education;Patient/family education;Manual techniques;Passive range of motion   PT Next Visit Plan e-stim, manuall therapy for trigger points in R upper and middle trap; check HEP, progress as tolerated; begin pec  strengthening   PT Home Exercise Plan cervical stretching, pec strengthening   Consulted and Agree with Plan of Care Patient         Problem List Patient Active Problem List   Diagnosis Date Noted  . Costochondritis 01/12/2015  . Hyperlipidemia 05/08/2014  . Essential hypertension 04/04/2014    Kary Kos, PT 04/08/2015, 9:54 PM  [facility] [address] [place], [place], [address] Phone: [phone]   Fax:  [phone]  Name: Kevin Bailey MRN: [id number] Date of Birth: 07/25/1970

## 2015-04-15 ENCOUNTER — Encounter: Payer: Self-pay | Admitting: Physical Therapy

## 2015-04-15 ENCOUNTER — Ambulatory Visit: Payer: PRIVATE HEALTH INSURANCE | Admitting: Physical Therapy

## 2015-04-15 DIAGNOSIS — M6281 Muscle weakness (generalized): Secondary | ICD-10-CM

## 2015-04-15 DIAGNOSIS — R29898 Other symptoms and signs involving the musculoskeletal system: Secondary | ICD-10-CM

## 2015-04-15 DIAGNOSIS — M542 Cervicalgia: Secondary | ICD-10-CM

## 2015-04-15 DIAGNOSIS — M546 Pain in thoracic spine: Secondary | ICD-10-CM

## 2015-04-15 NOTE — Therapy (Signed)
[facility] [address] [place], [place], [address] Phone: [phone]   Fax:  [phone]  Physical Therapy Treatment  Patient Details  Name: Kevin Bailey MRN: [id number] Date of Birth: [birth year] Referring Provider: Dr. Lucia Gaskins  Encounter Date: 04/15/2015      PT End of Session - 04/15/15 0807    Visit Number 2   Number of Visits 9   Date for PT Re-Evaluation 05/06/15   Authorization Type [employer]   PT Start Time 0804   PT Stop Time 0845   PT Time Calculation (min) 41 min      Past Medical History  Diagnosis Date  . Acid reflux     Past Surgical History  Procedure Laterality Date  . Wisdom tooth extraction      There were no vitals filed for this visit.  Visit Diagnosis:  Musculoskeletal neck pain  Right-sided thoracic back pain  Decreased range of motion of neck  Decreased muscle strength      Subjective Assessment - 04/15/15 0805    Subjective No new complaints. Feels better with exercises given last session and with use of estim last time.   Pertinent History Chondrochondritis from snowblower job (overuse) about 1 yr ago   Patient Stated Goals resolve the pain and get back to normal   Currently in Pain? Yes   Pain Score 3    Pain Location Neck   Pain Orientation Right   Pain Descriptors / Indicators Sore   Pain Type Acute pain   Pain Onset More than a month ago   Pain Frequency Intermittent   Aggravating Factors  worse in morning when waking up, cervical flexion   Pain Relieving Factors exercises, stretching, heat, biofreeze            [facility] Adult PT Treatment/Exercise - 04/15/15 1333    Electrical Stimulation   Electrical Stimulation Location cervical paraspinals/upper traps using EMPI tens unit on back preset settings                            Electrical Stimulation Action used concurrently with ther ex in session for decreased pain with activity   Electrical Stimulation  Parameters intensity to tolerance   Electrical Stimulation Goals Pain   Manual Therapy   Manual therapy comments all manual therapy performed to bil cervical paraspinals, upper traps, rhomboids and STM/scalenes for decreased tightness/pain and increased tissue extensibility                              Soft tissue mobilization yes   Myofascial Release yes   Manual Traction sustained holds of 30-45 seconds x 4-5 reps; also performed suboccipital release for decreased pain/tightness.                       Neural Stretch passive stretching of bil upper traps, scalenes and STM for decreased overall muscle tightness.       Exercises Reviewed HEP issued on eval with minimal cues needed on correct hold times and positions with exercise/stretches.  UBE  Level 1.5 x 2 minutes each fwd/bwd with cues on posture/scapular depression  red pball at wall - rolling up with flexion stretch at top and then back down x 10 reps - scapular stability circles, emphasis on small movements with scapular depression x 10 reps each way - pushups x 10 reps with cues/emphasis  on shoulder depression and scapular stability  With 1# weights back to ball at wall - posteiror shoulder rolls x 10 reps, arms in extension with weights in hands - alt UE raises x 10 reps each side, cues on form - X<>Y x 10 reps with cues on scapular/shoulder depression with Y - angel wings x 10 reps, cues on form        PT Short Term Goals - 04/08/15 2141    PT SHORT TERM GOAL #1   Title see LTG's           PT Long Term Goals - 04/08/15 2142    PT LONG TERM GOAL #1   Title Report pain in R posterior neck and mid back to be </= 2/10 for icnr. ease with work activities.  (05-06-15)   Baseline 6/10 intensity reported in neck and back   Time 4   Period Weeks   Status New   PT LONG TERM GOAL #2   Title Pt will report at least 25% improvement in ability to carry items to demo incr. pectoralis strength. (05-06-15)   Time 4   Period  Weeks   Status New   PT LONG TERM GOAL #3   Title Increase cervical L lateral flexion and rotation to WNL's for incr. ease with driving. (1-61-09)   Baseline L rotation 46 degrees; lateral flexion 24 degrees   Time 4   Period Weeks   Status New   PT LONG TERM GOAL #4   Title Independent in HEP for cervical ROM and pectoralis strengthening. (05-06-15)   Time 4   Period Weeks   Status New           Plan - 04/15/15 6045    Clinical Impression Statement Today's skilled session focused on decreased pain and increased cervical mobility/strenghening with no issues reported. Pt contineus to have right>left muscle tightness and trigger points. Pt shown how to use therapy cane to self release trigger points (spouse currently helping him). Pt felt he wa able to better get at the trigger points with the therapy cane and plans to look into purchasing one. TENS used concurrently with exercises with on increase in pain reported. Pt is making steady progress toward goals.   Pt will benefit from skilled therapeutic intervention in order to improve on the following deficits Pain;Decreased range of motion;Decreased strength;Increased muscle spasms   Rehab Potential Good   PT Frequency 2x / week   PT Duration 4 weeks   PT Treatment/Interventions ADLs/Self Care Home Management;Cryotherapy;Electrical Stimulation;Moist Heat;Ultrasound;Therapeutic exercise;Therapeutic activities;Neuromuscular re-education;Patient/family education;Manual techniques;Passive range of motion   PT Next Visit Plan e-stim, manuall therapy for trigger points in R upper and middle trap; progress HEP as tolerated; begin pec strengthening   PT Home Exercise Plan cervical stretching, pec strengthening   Consulted and Agree with Plan of Care Patient        Problem List Patient Active Problem List   Diagnosis Date Noted  . Costochondritis 01/12/2015  . Hyperlipidemia 05/08/2014  . Essential hypertension 04/04/2014    Sallyanne Kuster 04/15/2015, 2:17 PM  Sallyanne Kuster, PTA, [facility] [address] [place], [place] [address] [phone] 04/15/2015, 2:17 PM   Name: Arif Amendola MRN: [id number] Date of Birth: 10-06-1970

## 2015-04-17 ENCOUNTER — Ambulatory Visit: Payer: PRIVATE HEALTH INSURANCE | Admitting: Physical Therapy

## 2015-04-20 ENCOUNTER — Ambulatory Visit: Payer: PRIVATE HEALTH INSURANCE | Admitting: Physical Therapy

## 2015-04-24 ENCOUNTER — Ambulatory Visit: Payer: PRIVATE HEALTH INSURANCE | Admitting: Physical Therapy

## 2015-04-28 ENCOUNTER — Encounter: Payer: Self-pay | Admitting: Physical Therapy

## 2015-04-28 ENCOUNTER — Ambulatory Visit: Payer: PRIVATE HEALTH INSURANCE | Attending: Neurology | Admitting: Physical Therapy

## 2015-04-28 DIAGNOSIS — M546 Pain in thoracic spine: Secondary | ICD-10-CM | POA: Diagnosis present

## 2015-04-28 DIAGNOSIS — M6281 Muscle weakness (generalized): Secondary | ICD-10-CM | POA: Diagnosis present

## 2015-04-28 DIAGNOSIS — R29898 Other symptoms and signs involving the musculoskeletal system: Secondary | ICD-10-CM

## 2015-04-28 DIAGNOSIS — M542 Cervicalgia: Secondary | ICD-10-CM | POA: Insufficient documentation

## 2015-04-28 NOTE — Therapy (Signed)
[facility] [address] [place], [place], [address] Phone: [phone]   Fax:  [phone]  Physical Therapy Treatment  Patient Details  Name: Kevin Bailey MRN: [id number] Date of Birth: 11/07/1970 Referring Provider: Dr. Lucia GaskinsAhern  Encounter Date: 04/28/2015      PT End of Session - 04/28/15 1536    Visit Number 3   Number of Visits 9   Date for PT Re-Evaluation 05/06/15   Authorization Type [employer]   PT Start Time 1530   PT Stop Time 1609   PT Time Calculation (min) 39 min      Past Medical History  Diagnosis Date  . Acid reflux     Past Surgical History  Procedure Laterality Date  . Wisdom tooth extraction      There were no vitals filed for this visit.  Visit Diagnosis:  Musculoskeletal neck pain  Right-sided thoracic back pain  Decreased range of motion of neck  Decreased muscle strength      Subjective Assessment - 04/28/15 1532    Subjective Reports the neck feels much better, mostly soreness in pec's still. Did purchase a therapy cane, has not arrived yet. Decided not to get a TENS unit at this time, does not feel like he needs it at this time. Reports the spasms have stopped as well.    Pertinent History Chondrochondritis from snowblower job (overuse) about 1 yr ago   Patient Stated Goals resolve the pain and get back to normal   Currently in Pain? Yes   Pain Score 3    Pain Location Other (Comment)  pec muscles   Pain Orientation Lateral;Right;Left   Pain Descriptors / Indicators Aching;Sore   Pain Type Acute pain   Pain Onset More than a month ago   Pain Frequency Intermittent   Aggravating Factors  working out with UE weights (shoulder flexion mostly)   Pain Relieving Factors stretching, heat, biofreeze     Treatment: Exercises: UBE level 1.5 x 2 minutes each forward/backwards  Red pball at wall: Rolling up/down wall with flexion stretch at top, 5 sec holds x 10  reps Scapular circles with emphasis on scapular depression/retration x 10 reps each way Push ups x 10 reps with cues to depress shoulders  With red theraband: Rows x 10 reps, 5 sec holds Shoulder extension x 10 reps, 5 sec holds Shoulder horizontal abduction x 10 reps  Prone off mat edge from waist down: Rolling ball out and back in x 10 reps with emphasis on scapular stability Push ups x 6 reps (stopped due to complaints of pressure on abdomen), emphasis on scapular stability  Prone on mat with chin in corner: using bil 2# hand weights Superman x 10 reps Birdman x 10 reps Aquaman x 10 reps  Stretching: added all of these to HEP. No written instructions provided as pt reported not needing any Modified plank on elevated mat table: upper body rotation with reaching up and back x 10 reps each side Back against doorframe: bil UE's reaching up and back x 10 reps Attempted supine with foam noodle along spine and arms out, pt reported not feeling a stretch this way. Pt to try with larger rolled towel/noodle at home and report back next session.         PT Education - 04/29/15 1341    Education provided Yes   Education Details HEP: added 3 new stretches today without any issues reported.   Methods Explanation;Demonstration;Verbal cues   Comprehension Verbalized  understanding;Returned demonstration          PT Short Term Goals - 04/08/15 2141    PT SHORT TERM GOAL #1   Title see LTG's           PT Long Term Goals - 04/08/15 2142    PT LONG TERM GOAL #1   Title Report pain in R posterior neck and mid back to be </= 2/10 for icnr. ease with work activities.  (05-06-15)   Baseline 6/10 intensity reported in neck and back   Time 4   Period Weeks   Status New   PT LONG TERM GOAL #2   Title Pt will report at least 25% improvement in ability to carry items to demo incr. pectoralis strength. (05-06-15)   Time 4   Period Weeks   Status New   PT LONG TERM GOAL #3   Title  Increase cervical L lateral flexion and rotation to WNL's for incr. ease with driving. (1-61-09)   Baseline L rotation 46 degrees; lateral flexion 24 degrees   Time 4   Period Weeks   Status New   PT LONG TERM GOAL #4   Title Independent in HEP for cervical ROM and pectoralis strengthening. (05-06-15)   Time 4   Period Weeks   Status New           Plan - 04/28/15 1536    Clinical Impression Statement Pt has made tremendous progress toward goals. Added new stretches to HEP today without any issues reported in session. Advanced strengthening ex's in session today as well without any issues reported.    Pt will benefit from skilled therapeutic intervention in order to improve on the following deficits Pain;Decreased range of motion;Decreased strength;Increased muscle spasms   Rehab Potential Good   PT Frequency 2x / week   PT Duration 4 weeks   PT Treatment/Interventions ADLs/Self Care Home Management;Cryotherapy;Electrical Stimulation;Moist Heat;Ultrasound;Therapeutic exercise;Therapeutic activities;Neuromuscular re-education;Patient/family education;Manual techniques;Passive range of motion   PT Next Visit Plan check goals. PT has  verbally okayed to extend 1 week on POC due to pt missing a week with work, pt unsure if he needs this. Will discuss again at last (next) visist.    PT Home Exercise Plan cervical stretching, pec strengthening   Consulted and Agree with Plan of Care Patient        Problem List Patient Active Problem List   Diagnosis Date Noted  . Costochondritis 01/12/2015  . Hyperlipidemia 05/08/2014  . Essential hypertension 04/04/2014    Sallyanne Kuster, PTA, [facility] [address] [place], [place] [address] [phone] 04/29/2015, 1:41 PM   Name: Kevin Bailey MRN: [id number] Date of Birth: 1970-06-03

## 2015-05-01 ENCOUNTER — Encounter: Payer: Self-pay | Admitting: Physical Therapy

## 2015-05-01 ENCOUNTER — Ambulatory Visit: Payer: PRIVATE HEALTH INSURANCE | Admitting: Physical Therapy

## 2015-05-01 DIAGNOSIS — M542 Cervicalgia: Secondary | ICD-10-CM | POA: Diagnosis not present

## 2015-05-01 DIAGNOSIS — R29898 Other symptoms and signs involving the musculoskeletal system: Secondary | ICD-10-CM

## 2015-05-01 DIAGNOSIS — M546 Pain in thoracic spine: Secondary | ICD-10-CM

## 2015-05-01 DIAGNOSIS — M6281 Muscle weakness (generalized): Secondary | ICD-10-CM

## 2015-05-01 NOTE — Patient Instructions (Signed)
Resisted Horizontal Abduction: Bilateral    Sit or stand, tubing in both hands, arms out in front. Keeping arms straight, pinch shoulder blades together and stretch arms out. Repeat __10_ times per set.  Do _1-2_ sessions per day.  http://orth.exer.us/968   Copyright  VHI. All rights reserved.  (Home) Retraction: Row - Bilateral (Anchor)    Standing facing anchor, arms reaching forward, pull hands toward stomach, pinching shoulder blades together. Hold for 5 seconds. Repeat _10_ times per set. Do _1_ sets per session. Do _1-2_ sessions per week. Copyright  VHI. All rights reserved.  Strengthening: Resisted Extension    Hold tubing in both hands, facing achors. Pull arms back, elbows straight. Hold for 5 seconds.  Repeat __10 times per set. Do _1__ sets per session. Do _1-2_ sessions per day.  http://orth.exer.us/832   Copyright  VHI. All rights reserved.

## 2015-05-08 NOTE — Therapy (Signed)
[facility] [address] [place], [place], [address] Phone: [phone]   Fax:  [phone]  Physical Therapy Treatment  Patient Details  Name: Kevin Bailey MRN: [id number] Date of Birth: 1970-08-20 Referring Provider: Dr. Jaynee Eagles  Encounter Date: 05/01/2015   05/01/15 1535  PT Visits / Re-Eval  Visit Number 4  Number of Visits 9  Date for PT Re-Evaluation 05/06/15  Authorization  Authorization Type [employer]  PT Time Calculation  PT Start Time 1530  PT Stop Time 1600 (left early due to discharge)  PT Time Calculation (min) 30 min    Past Medical History  Diagnosis Date  . Acid reflux     Past Surgical History  Procedure Laterality Date  . Wisdom tooth extraction      There were no vitals filed for this visit.  Visit Diagnosis:  Musculoskeletal neck pain  Right-sided thoracic back pain  Decreased range of motion of neck  Decreased muscle strength   Treatment: Goals checked with pt meeting cervical range of motion and pain goals .  Pt able to demo all current HEP exercises without cues with handout. Added theraband exercises today without any issues reported. Theraband issued today as well.         05/01/15 1615  PT Education  Education provided Yes  Education Details added theraband exercises today without any issues  Person(s) Educated Patient  Methods Explanation;Demonstration;Verbal cues;Handout  Comprehension Verbalized understanding;Returned demonstration          PT Short Term Goals - 04/08/15 2141    PT SHORT TERM GOAL #1   Title see LTG's          PT Long Term Goals - 05/01/15 1536    PT LONG TERM GOAL #1   Title Report pain in R posterior neck and mid back to be </= 2/10 for icnr. ease with work activities.  (05-06-15)   Baseline 05/01/15: no pain currently.    Status Achieved   PT LONG TERM GOAL #2   Title Pt will report at least 25% improvement in ability to  carry items to demo incr. pectoralis strength. (05-06-15)   Baseline 05/01/15: pt reports no issues with this. Only feels the pain/stretch in his pec when sitting still at computer desk. discussed use of lumbar roll to assist with postural positioning.   Status Achieved   PT LONG TERM GOAL #3   Title Increase cervical L lateral flexion and rotation to WNL's for incr. ease with driving. (3-54-56)   Baseline 05/01/15: bil lateral flexion = 40*;left rotation =80*, right rotation = 75*   Time 4   Period Weeks   Status Achieved   PT LONG TERM GOAL #4   Title Independent in HEP for cervical ROM and pectoralis strengthening. (05-06-15)   Baseline met on 05/01/15   Status Achieved       05/01/15 1535  Plan  Clinical Impression Statement Pt has met all LTGs and feels ready for discharge from PT. HEP updated with no issues reported. Will discharge today per PT plan of care.  Pt will benefit from skilled therapeutic intervention in order to improve on the following deficits Pain;Decreased range of motion;Decreased strength;Increased muscle spasms  Rehab Potential Good  PT Frequency 2x / week  PT Duration 4 weeks  PT Treatment/Interventions ADLs/Self Care Home Management;Cryotherapy;Electrical Stimulation;Moist Heat;Ultrasound;Therapeutic exercise;Therapeutic activities;Neuromuscular re-education;Patient/family education;Manual techniques;Passive range of motion  PT Next Visit Plan discharge today.  PT Home Exercise Plan cervical stretching, pec strengthening  Consulted  and Agree with Plan of Care Patient     Problem List Patient Active Problem List   Diagnosis Date Noted  . Costochondritis 01/12/2015  . Hyperlipidemia 05/08/2014  . Essential hypertension 04/04/2014    Kathy Bury, PTA, [facility] [address], [address] , [place] [address] [phone] 05/08/2015, 11:51 AM   Name: Raad Wayne Pontiff MRN: [id number] Date of Birth: 12/15/1970    

## 2015-05-20 ENCOUNTER — Encounter: Payer: Self-pay | Admitting: *Deleted

## 2015-05-20 ENCOUNTER — Emergency Department
Admission: EM | Admit: 2015-05-20 | Discharge: 2015-05-20 | Disposition: A | Discharge status: Home / Self Care | Payer: PRIVATE HEALTH INSURANCE | Source: Home / Self Care | Attending: Family Medicine | Admitting: Family Medicine

## 2015-05-20 DIAGNOSIS — J019 Acute sinusitis, unspecified: Secondary | ICD-10-CM | POA: Diagnosis not present

## 2015-05-20 MED ORDER — DOXYCYCLINE HYCLATE 100 MG PO CAPS: 100.0000 mg | ORAL_CAPSULE | Freq: Two times a day (BID) | ORAL | Status: DC

## 2015-05-20 MED ORDER — METHYLPREDNISOLONE SODIUM SUCC 40 MG IJ SOLR: 80.0000 mg | Freq: Once | INTRAMUSCULAR | Status: DC

## 2015-05-20 MED ORDER — IPRATROPIUM-ALBUTEROL 0.5-2.5 (3) MG/3ML IN SOLN: 3.0000 mL | Freq: Four times a day (QID) | RESPIRATORY_TRACT | Status: DC

## 2015-05-20 NOTE — Discharge Instructions (Signed)
You may take 400-600mg Ibuprofen (Motrin) every 6-8 hours for fever and pain  °Alternate with Tylenol  °You may take 500mg Tylenol every 4-6 hours as needed for fever and pain  °Follow-up with your primary care provider next week for recheck of symptoms if not improving.  °Be sure to drink plenty of fluids and rest, at least 8hrs of sleep a night, preferably more while you are sick. °Return urgent care or go to closest ER if you cannot keep down fluids/signs of dehydration, fever not reducing with Tylenol, difficulty breathing/wheezing, stiff neck, worsening condition, or other concerns (see below)  °Please take antibiotics as prescribed and be sure to complete entire course even if you start to feel better to ensure infection does not come back. ° ° °Sinus Rinse °WHAT IS A SINUS RINSE? °A sinus rinse is a simple home treatment that is used to rinse your sinuses with a sterile mixture of salt and water (saline solution). Sinuses are air-filled spaces in your skull behind the bones of your face and forehead that open into your nasal cavity. °You will use the following: °· Saline solution. °· Neti pot or spray bottle. This releases the saline solution into your nose and through your sinuses. Neti pots and spray bottles can be purchased at your local pharmacy, a health food store, or online. °WHEN WOULD I DO A SINUS RINSE? °A sinus rinse can help to clear mucus, dirt, dust, or pollen from the nasal cavity. You may do a sinus rinse when you have a cold, a virus, nasal allergy symptoms, a sinus infection, or stuffiness in the nose or sinuses. °If you are considering a sinus rinse: °· Ask your child's health care provider before performing a sinus rinse on your child. °· Do not do a sinus rinse if you have had ear or nasal surgery, ear infection, or blocked ears. °HOW DO I DO A SINUS RINSE? °· Wash your hands. °· Disinfect your device according to the directions provided and then dry it. °· Use the solution that comes  with your device or one that is sold separately in stores. Follow the mixing directions on the package. °· Fill your device with the amount of saline solution as directed by the device instructions. °· Stand over a sink and tilt your head sideways over the sink. °· Place the spout of the device in your upper nostril (the one closer to the ceiling). °· Gently pour or squeeze the saline solution into the nasal cavity. The liquid should drain to the lower nostril if you are not overly congested. °· Gently blow your nose. Blowing too hard may cause ear pain. °· Repeat in the other nostril. °· Clean and rinse your device with clean water and then air-dry it. °ARE THERE RISKS OF A SINUS RINSE?  °Sinus rinse is generally very safe and effective. However, there are a few risks, which include:  °· A burning sensation in the sinuses. This may happen if you do not make the saline solution as directed. Make sure to follow all directions when making the saline solution. °· Infection from contaminated water. This is rare, but possible. °· Nasal irritation. °  °This information is not intended to replace advice given to you by your health care provider. Make sure you discuss any questions you have with your health care provider. °  °Document Released: 09/04/2013 Document Reviewed: 09/04/2013 °Elsevier Interactive Patient Education ©2016 [employer] ° °Sinusitis, Adult °Sinusitis is redness, soreness, and inflammation of the paranasal sinuses.   Paranasal sinuses are air pockets within the bones of your face. They are located beneath your eyes, in the middle of your forehead, and above your eyes. In healthy paranasal sinuses, mucus is able to drain out, and air is able to circulate through them by way of your nose. However, when your paranasal sinuses are inflamed, mucus and air can become trapped. This can allow bacteria and other germs to grow and cause infection. °Sinusitis can develop quickly and last only a short time (acute)  or continue over a long period (chronic). Sinusitis that lasts for more than 12 weeks is considered chronic. °CAUSES °Causes of sinusitis include: °· Allergies. °· Structural abnormalities, such as displacement of the cartilage that separates your nostrils (deviated septum), which can decrease the air flow through your nose and sinuses and affect sinus drainage. °· Functional abnormalities, such as when the small hairs (cilia) that line your sinuses and help remove mucus do not work properly or are not present. °SIGNS AND SYMPTOMS °Symptoms of acute and chronic sinusitis are the same. The primary symptoms are pain and pressure around the affected sinuses. Other symptoms include: °· Upper toothache. °· Earache. °· Headache. °· Bad breath. °· Decreased sense of smell and taste. °· A cough, which worsens when you are lying flat. °· Fatigue. °· Fever. °· Thick drainage from your nose, which often is green and may contain pus (purulent). °· Swelling and warmth over the affected sinuses. °DIAGNOSIS °Your health care provider will perform a physical exam. During your exam, your health care provider may perform any of the following to help determine if you have acute sinusitis or chronic sinusitis: °· Look in your nose for signs of abnormal growths in your nostrils (nasal polyps). °· Tap over the affected sinus to check for signs of infection. °· View the inside of your sinuses using an imaging device that has a light attached (endoscope). °If your health care provider suspects that you have chronic sinusitis, one or more of the following tests may be recommended: °· Allergy tests. °· Nasal culture. A sample of mucus is taken from your nose, sent to a lab, and screened for bacteria. °· Nasal cytology. A sample of mucus is taken from your nose and examined by your health care provider to determine if your sinusitis is related to an allergy. °TREATMENT °Most cases of acute sinusitis are related to a viral infection and will  resolve on their own within 10 days. Sometimes, medicines are prescribed to help relieve symptoms of both acute and chronic sinusitis. These may include pain medicines, decongestants, nasal steroid sprays, or saline sprays. °However, for sinusitis related to a bacterial infection, your health care provider will prescribe antibiotic medicines. These are medicines that will help kill the bacteria causing the infection. °Rarely, sinusitis is caused by a fungal infection. In these cases, your health care provider will prescribe antifungal medicine. °For some cases of chronic sinusitis, surgery is needed. Generally, these are cases in which sinusitis recurs more than 3 times per year, despite other treatments. °HOME CARE INSTRUCTIONS °· Drink plenty of water. Water helps thin the mucus so your sinuses can drain more easily. °· Use a humidifier. °· Inhale steam 3-4 times a day (for example, sit in the bathroom with the shower running). °· Apply a warm, moist washcloth to your face 3-4 times a day, or as directed by your health care provider. °· Use saline nasal sprays to help moisten and clean your sinuses. °· Take medicines only as directed   by your health care provider. °· If you were prescribed either an antibiotic or antifungal medicine, finish it all even if you start to feel better. °SEEK IMMEDIATE MEDICAL CARE IF: °· You have increasing pain or severe headaches. °· You have nausea, vomiting, or drowsiness. °· You have swelling around your face. °· You have vision problems. °· You have a stiff neck. °· You have difficulty breathing. °  °This information is not intended to replace advice given to you by your health care provider. Make sure you discuss any questions you have with your health care provider. °  °Document Released: 02/07/2005 Document Revised: 02/28/2014 Document Reviewed: 02/22/2011 °Elsevier Interactive Patient Education ©2016 Elsevier Inc. ° ° °

## 2015-05-20 NOTE — ED Notes (Signed)
Pt c/o nasal congestion x 2 mths. He has taken [facility] brand decongestant and Claritin prn.

## 2015-05-20 NOTE — ED Provider Notes (Signed)
CSN: [id number]     Arrival date & time 05/20/15  1449 History   First MD Initiated Contact with Patient 05/20/15 1508     Chief Complaint  Patient presents with  . Nasal Congestion   (Consider location/radiation/quality/duration/timing/severity/associated sxs/prior Treatment) HPI The pt is a 45yo male presenting to [facility] with c/o 2 months of nasal congestion and sinus pressure. Pt was seen initially for symptoms here at [facility] and prescribed amoxicillin and prednisone. The prednisone made him very irritable, states he will never take again.  He states his congestion only improved for a few days.  He has been taking OTC decongestant and Claritin as needed.  He notes he was seen by his PCP about 2 weeks ago and prescribed Augmentin for the same symptoms but was only able to take 3 days, then stopped it himself due to GI symptoms. He never called his PCP back about stopping the medication.  He has had chills and frontal headache. Others at work also sick. Denies n/v/d.  Past Medical History  Diagnosis Date  . Acid reflux    Past Surgical History  Procedure Laterality Date  . Wisdom tooth extraction     Family History  Problem Relation Age of Onset  . Hypertension Father   . Anemia Father   . Diabetes      grandmother   . Diabetes Paternal Grandmother   . Heart disease Paternal Grandfather   . Neuropathy Neg Hx   . Neurofibromatosis Neg Hx   . Multiple sclerosis Neg Hx    Social History  Substance Use Topics  . Smoking status: Never Smoker   . Smokeless tobacco: Never Used  . Alcohol Use: 1.2 oz/week    2 Standard drinks or equivalent per week    Review of Systems  Constitutional: Positive for fever, chills and fatigue.  HENT: Positive for congestion, rhinorrhea, sinus pressure, sneezing and sore throat. Negative for ear pain, trouble swallowing and voice change.   Respiratory: Positive for cough. Negative for shortness of breath.   Cardiovascular: Negative for chest pain and  palpitations.  Gastrointestinal: Negative for nausea, vomiting, abdominal pain and diarrhea.  Musculoskeletal: Negative for myalgias, back pain and arthralgias.  Skin: Negative for rash.  Neurological: Positive for headaches. Negative for dizziness and light-headedness.    Allergies  Flonase; Other; and Prednisone  Home Medications   Prior to Admission medications   Medication Sig Start Date End Date Taking? Authorizing Provider  aspirin 81 MG tablet Take 81 mg by mouth daily.    Historical Provider, MD  doxycycline (VIBRAMYCIN) 100 MG capsule Take 1 capsule (100 mg total) by mouth 2 (two) times daily. One po bid x 7 days 05/20/15   Junius Finner, PA-C   Meds Ordered and Administered this Visit  Medications - No data to display  BP 137/90 mmHg  Pulse 103  Temp(Src) 98.5 F (36.9 C) (Oral)  Resp 18  Ht  (1.702 m)  Wt 215 lb (97.523 kg)  BMI 33.67 kg/m2  SpO2 96% No data found.   Physical Exam  Constitutional: He appears well-developed and well-nourished.  HENT:  Head: Normocephalic and atraumatic.  Right Ear: Tympanic membrane normal.  Left Ear: A middle ear effusion is present.  Nose: Mucosal edema and rhinorrhea present. Right sinus exhibits maxillary sinus tenderness and frontal sinus tenderness. Left sinus exhibits maxillary sinus tenderness and frontal sinus tenderness.  Mouth/Throat: Uvula is midline, oropharynx is clear and moist and mucous membranes are normal.  Eyes: Conjunctivae are normal.  No scleral icterus.  Neck: Normal range of motion. Neck supple.  Cardiovascular: Normal rate, regular rhythm and normal heart sounds.   Pulmonary/Chest: Effort normal and breath sounds normal. No respiratory distress. He has no wheezes. He has no rales. He exhibits no tenderness.  Abdominal: Soft. He exhibits no distension. There is no tenderness.  Musculoskeletal: Normal range of motion.  Neurological: He is alert.  Skin: Skin is warm and dry.  Nursing note and vitals  reviewed.   ED Course  Procedures (including critical care time)  Labs Review Labs Reviewed - No data to display  Imaging Review No results found.    MDM   1. Acute rhinosinusitis    Pt c/o sinus congestion, pain and pressure for 2 months. No relief with prednisone and amoxicillin 2 months ago. Sinus tenderness noted on exam.  Due to incomplete treatment with augmentin 2 weeks ago by his PCP, will prescribe different antibiotic.  Rx: Doxycycline  Advised pt to use acetaminophen and ibuprofen as needed for fever and pain. Encouraged sinus rinses. Encouraged rest and fluids. F/u with PCP in 1 week if not improving, sooner if worsening. Pt verbalized understanding and agreement with tx plan.     Junius Finnerrin O'Malley, PA-C 05/20/15 1549

## 2015-07-29 ENCOUNTER — Telehealth: Payer: Self-pay | Admitting: Neurology

## 2015-07-29 NOTE — Telephone Encounter (Signed)
Dr Lucia GaskinsAhern- pt already scheduled f/u for next week. Do you want me to call back for any reason?

## 2015-07-29 NOTE — Telephone Encounter (Addendum)
Pt's wife called sts he is having low back pain. She said the pain in the neck has "settled down" but he is having more discomfort with lumbar pain. This is a symptom that was briefly mentioned at last OV 03/23/15.  Operator advised her if there were anymore questions, the RN would call.  Appt schedule for 08/07/15

## 2015-07-29 NOTE — Telephone Encounter (Signed)
No, that is fine thanks

## 2015-08-07 ENCOUNTER — Telehealth: Payer: Self-pay

## 2015-08-07 ENCOUNTER — Ambulatory Visit: Payer: PRIVATE HEALTH INSURANCE | Admitting: Neurology

## 2015-08-07 NOTE — Telephone Encounter (Signed)
Pt called the answer phone on 08/07/15 at 5:50 AM to cancel his appt for today.

## 2015-08-10 ENCOUNTER — Encounter: Payer: Self-pay | Admitting: Neurology

## 2015-09-21 ENCOUNTER — Ambulatory Visit: Payer: PRIVATE HEALTH INSURANCE | Admitting: Neurology

## 2015-10-19 ENCOUNTER — Emergency Department
Admission: EM | Admit: 2015-10-19 | Discharge: 2015-10-19 | Disposition: A | Discharge status: Home / Self Care | Payer: Managed Care, Other (non HMO) | Source: Home / Self Care | Attending: Family Medicine | Admitting: Family Medicine

## 2015-10-19 ENCOUNTER — Encounter: Payer: Self-pay | Admitting: *Deleted

## 2015-10-19 DIAGNOSIS — J302 Other seasonal allergic rhinitis: Secondary | ICD-10-CM | POA: Diagnosis not present

## 2015-10-19 DIAGNOSIS — J019 Acute sinusitis, unspecified: Secondary | ICD-10-CM

## 2015-10-19 DIAGNOSIS — J069 Acute upper respiratory infection, unspecified: Secondary | ICD-10-CM

## 2015-10-19 DIAGNOSIS — B9789 Other viral agents as the cause of diseases classified elsewhere: Secondary | ICD-10-CM

## 2015-10-19 MED ORDER — AZELASTINE HCL 0.1 % NA SOLN: 2.0000 | Freq: Two times a day (BID) | NASAL | 1 refills | Status: DC

## 2015-10-19 MED ORDER — DOXYCYCLINE HYCLATE 100 MG PO CAPS: 100.0000 mg | ORAL_CAPSULE | Freq: Two times a day (BID) | ORAL | 0 refills | Status: DC

## 2015-10-19 NOTE — ED Provider Notes (Signed)
[facility]    CSN: [id number] Arrival date & time: 10/19/15  0803  First Provider Contact:  None       History   Chief Complaint Chief Complaint  Patient presents with  . Sinus Problem    HPI Kevin Bailey is a 45 y.o. male.   Patient has a history of seasonal allergies worse in the summer. About one week ago he developed typical cold-like symptoms developing over several days,  including mild sore throat, increased sinus congestion, headache, fatigue, and cough. His sinus congestion has persisted and he now complains of bilateral facial pressure. He has a history of recurring sinusitis.  He has not been able to tolerate corticosteroids (decreases his blood pressure).  He is taking phenylephrine with some improvement (he states that he cannot tolerate pseurdoephedrine).   No language interpreter was used.    Past Medical History:  Diagnosis Date  . Acid reflux     Patient Active Problem List   Diagnosis Date Noted  . Costochondritis 01/12/2015  . Hyperlipidemia 05/08/2014  . Essential hypertension 04/04/2014    Past Surgical History:  Procedure Laterality Date  . WISDOM TOOTH EXTRACTION         Home Medications    Prior to Admission medications   Medication Sig Start Date End Date Taking? Authorizing Provider  aspirin 81 MG tablet Take 81 mg by mouth daily.    Historical Provider, MD  azelastine (ASTELIN) 0.1 % nasal spray Place 2 sprays into both nostrils 2 (two) times daily. 10/19/15   Lattie Haw, MD  doxycycline (VIBRAMYCIN) 100 MG capsule Take 1 capsule (100 mg total) by mouth 2 (two) times daily. Take with food. 10/19/15   Lattie Haw, MD    Family History Family History  Problem Relation Age of Onset  . Hypertension Father   . Anemia Father   . Diabetes      grandmother   . Diabetes Paternal Grandmother   . Heart disease Paternal Grandfather   . Neuropathy Neg Hx   . Neurofibromatosis Neg Hx   . Multiple sclerosis  Neg Hx     Social History Social History  Substance Use Topics  . Smoking status: Never Smoker  . Smokeless tobacco: Never Used  . Alcohol use 1.2 oz/week    2 Standard drinks or equivalent per week     Allergies   Prednisone; Flonase [fluticasone propionate]; and Other   Review of Systems Review of Systems + sore throat + cough No pleuritic pain No wheezing + nasal congestion + post-nasal drainage + sinus pain/pressure No itchy/red eyes No earache No hemoptysis No SOB No fever/chills No nausea No vomiting No abdominal pain No diarrhea No urinary symptoms No skin rash + fatigue + myalgias, resolved + headache   Physical Exam Triage Vital Signs ED Triage Vitals  Enc Vitals Group     BP 10/19/15 0823 139/89     Pulse Rate 10/19/15 0823 91     Resp --      Temp 10/19/15 0823 98.4 F (36.9 C)     Temp Source 10/19/15 0823 Oral     SpO2 10/19/15 0823 97 %     Weight 10/19/15 0823 235 lb (106.6 kg)     Height --      Head Circumference --      Peak Flow --      Pain Score 10/19/15 0825 5     Pain Loc --      Pain Edu? --  Excl. in GC? --    No data found.   Updated Vital Signs BP 139/89 (BP Location: Left Arm)   Pulse 91   Temp 98.4 F (36.9 C) (Oral)   Wt 235 lb (106.6 kg)   SpO2 97%   BMI 36.81 kg/m   Visual Acuity Right Eye Distance:   Left Eye Distance:   Bilateral Distance:    Right Eye Near:   Left Eye Near:    Bilateral Near:     Physical Exam Nursing notes and Vital Signs reviewed. Appearance:  Patient appears stated age, and in no acute distress Eyes:  Pupils are equal, round, and reactive to light and accomodation.  Extraocular movement is intact.  Conjunctivae are not inflamed  Ears:  Canals normal.  Tympanic membranes normal.  Nose:  Congested turbinates. Maxillary sinus tenderness is present.  Pharynx:  Normal Neck:  Supple.  Tender enlarged posterior/lateral nodes are palpated bilaterally  Lungs:  Clear to  auscultation.  Breath sounds are equal.  Moving air well. Heart:  Regular rate and rhythm without murmurs, rubs, or gallops.  Abdomen:  Nontender without masses or hepatosplenomegaly.  Bowel sounds are present.  No CVA or flank tenderness.  Extremities:  No edema.  Skin:  No rash present.    UC Treatments / Results  Labs (all labs ordered are listed, but only abnormal results are displayed) Labs Reviewed - No data to display  EKG  EKG Interpretation None       Radiology No results found.  Procedures Procedures (including critical care time)  Medications Ordered in UC Medications - No data to display   Initial Impression / Assessment and Plan / UC Course  I have reviewed the triage vital signs and the nursing notes.  Pertinent labs & imaging results that were available during my care of the patient were reviewed by me and considered in my medical decision making (see chart for details).  Clinical Course   Reviewed previous X-rays:  Sinus films done 09/14/12 showed distinct air/fluid level left maxillary sinus. Begin doxycycline 100mg  BID. Begin trial of Astelin nasal spray for his chronic allergic rhinitis. Take plain guaifenesin (1200mg  extended release tabs such as Mucinex) twice daily, with plenty of water, for cough and congestion.  May continue phenylephrine for sinus congestion.  Get adequate rest.   May use Afrin nasal spray (or generic oxymetazoline) twice daily for about 5 days and then discontinue.  Also recommend using saline nasal spray several times daily and saline nasal irrigation (AYR is a common brand).  Use Astelin nasal spray each morning after using Afrin nasal spray and saline nasal irrigation. Try warm salt water gargles for sore throat.  Stop all antihistamines for now, and other non-prescription cough/cold preparations.  Followup with ENT if  symptoms persist.    Final Clinical Impressions(s) / UC Diagnoses   Final diagnoses:  Seasonal allergic  rhinitis  Viral URI with cough  Acute rhinosinusitis, recurrent    New Prescriptions New Prescriptions   AZELASTINE (ASTELIN) 0.1 % NASAL SPRAY    Place 2 sprays into both nostrils 2 (two) times daily.   DOXYCYCLINE (VIBRAMYCIN) 100 MG CAPSULE    Take 1 capsule (100 mg total) by mouth 2 (two) times daily. Take with food.     Lattie HawStephen A Consuella Scurlock, MD 10/19/15 [phone]

## 2015-10-19 NOTE — Discharge Instructions (Signed)
Take plain guaifenesin (1200mg  extended release tabs such as Mucinex) twice daily, with plenty of water, for cough and congestion.  May continue phenylephrine for sinus congestion.  Get adequate rest.   May use Afrin nasal spray (or generic oxymetazoline) twice daily for about 5 days and then discontinue.  Also recommend using saline nasal spray several times daily and saline nasal irrigation (AYR is a common brand).  Use Astelin nasal spray each morning after using Afrin nasal spray and saline nasal irrigation. Try warm salt water gargles for sore throat.  Stop all antihistamines for now, and other non-prescription cough/cold preparations.

## 2015-10-19 NOTE — ED Triage Notes (Signed)
Pt reports he has sinus problems and "all the time but has gotten worse int he past 3 days".  Afebrile but c/o dizziness and facial pain. Taken generic sinus releif otc.

## 2015-11-06 ENCOUNTER — Emergency Department
Admission: EM | Admit: 2015-11-06 | Discharge: 2015-11-06 | Disposition: A | Discharge status: Home / Self Care | Payer: Managed Care, Other (non HMO) | Source: Home / Self Care | Attending: Emergency Medicine | Admitting: Emergency Medicine

## 2015-11-06 ENCOUNTER — Encounter: Payer: Self-pay | Admitting: *Deleted

## 2015-11-06 DIAGNOSIS — R109 Unspecified abdominal pain: Secondary | ICD-10-CM | POA: Diagnosis not present

## 2015-11-06 DIAGNOSIS — S39011A Strain of muscle, fascia and tendon of abdomen, initial encounter: Secondary | ICD-10-CM

## 2015-11-06 LAB — POCT URINALYSIS DIP (MANUAL ENTRY)
Bilirubin, UA: NEGATIVE
Blood, UA: NEGATIVE
Glucose, UA: NEGATIVE
Ketones, POC UA: NEGATIVE
Leukocytes, UA: NEGATIVE
Nitrite, UA: NEGATIVE
Protein Ur, POC: NEGATIVE
Spec Grav, UA: 1.025 (ref 1.005–1.03)
Urobilinogen, UA: 0.2 (ref 0–1)
pH, UA: 5.5 (ref 5–8)

## 2015-11-06 MED ORDER — TRAMADOL-ACETAMINOPHEN 37.5-325 MG PO TABS: 1.0000 | ORAL_TABLET | Freq: Four times a day (QID) | ORAL | 0 refills | Status: DC | PRN

## 2015-11-06 NOTE — ED Triage Notes (Signed)
Pt c/o 2 weeks of umbilical abdominal pain that started after rolling our of a bed. 1 week ago while doing yard work when bending over he heard and felt a popo in this same are. Denies N/V/D or fever. Also c/o RUQ that has been intermittent for 2 years but more recently worse.

## 2015-11-06 NOTE — Discharge Instructions (Signed)
No sign of appendicitis or gallbladder attack as cause for abdominal pain. Urine test is normal, no sign of urinary infection or kidney stone. Most likely, you have abdominal wall strain.it's possible he could have a small abdominal wall hernia, although I could not feel that on physical exam today. For the next 3 days, rest, no straining. Apply heat. Follow-up with your doctor or a general surgeon if not improving in 3 days. If any severe or worsening symptoms, go to emergency room immediately.

## 2015-11-06 NOTE — ED Provider Notes (Signed)
[facility]    CSN: [id number] Arrival date & time: 11/06/15  1133    History   Chief Complaint Chief Complaint  Patient presents with  . Abdominal Pain    HPI Kevin Bailey is a 45 y.o. male.   The history is provided by the patient. No language interpreter was used.  Abdominal Pain  Associated symptoms: no diarrhea, no nausea and no vomiting   complains of 2 weeks of umbilical abdominal pain,that started after rolling out of bed. The pain was mild and sore for the first week, but then after doing yard work when bending over he heard and felt a pop in the same umbilical area and for the past week, the umbilical pain progressed from mild to moderate as a soreness and burning and "pulling". No radiation. Improved with rest, worse with bending and movement and change of position. Denies right lower quadrant or right upper quadrant pain. No nausea or vomiting or diarrhea or change of bowel habits. Denies chest pain or shortness of breath or fever or chills or GU symptoms. Prior history of acid reflux, but he states this does not feel like acid reflux. Currently, the area umbilical pain is moderate, 4 out of 10 intensity. After further questioning, he feels he may have worsened the abdominal discomfort by straining to have a bowel movement when he had was constipated. He has since taken MiraLAX a few days ago, and had a normal BM yesterday.  Past Medical History:  Diagnosis Date  . Acid reflux     Patient Active Problem List   Diagnosis Date Noted  . Costochondritis 01/12/2015  . Hyperlipidemia 05/08/2014  . Essential hypertension 04/04/2014    Past Surgical History:  Procedure Laterality Date  . WISDOM TOOTH EXTRACTION         Home Medications    Prior to Admission medications   Medication Sig Start Date End Date Taking? Authorizing Provider  aspirin 81 MG tablet Take 81 mg by mouth daily.    Historical Provider, MD  azelastine (ASTELIN) 0.1 %  nasal spray Place 2 sprays into both nostrils 2 (two) times daily. 10/19/15   Lattie Haw, MD  traMADol-acetaminophen (ULTRACET) 37.5-325 MG tablet Take 1-2 tablets by mouth every 6 (six) hours as needed for severe pain. 11/06/15   Lajean Manes, MD    Family History Family History  Problem Relation Age of Onset  . Hypertension Father   . Anemia Father   . Diabetes      grandmother   . Diabetes Paternal Grandmother   . Heart disease Paternal Grandfather   . Neuropathy Neg Hx   . Neurofibromatosis Neg Hx   . Multiple sclerosis Neg Hx     Social History Social History  Substance Use Topics  . Smoking status: Never Smoker  . Smokeless tobacco: Never Used  . Alcohol use 1.2 oz/week    2 Standard drinks or equivalent per week     Allergies   Prednisone; Flonase [fluticasone propionate]; and Other   Review of Systems Review of Systems  Gastrointestinal: Positive for abdominal pain. Negative for blood in stool, diarrhea, nausea and vomiting.  Genitourinary: Negative for difficulty urinating.  All other systems reviewed and are negative.    Physical Exam Triage Vital Signs ED Triage Vitals  Enc Vitals Group     BP 11/06/15 1254 144/92     Pulse Rate 11/06/15 1254 96     Resp --      Temp 11/06/15 1254  98.1 F (36.7 C)     Temp Source 11/06/15 1254 Oral     SpO2 11/06/15 1254 97 %     Weight 11/06/15 1255 231 lb (104.8 kg)     Height --      Head Circumference --      Peak Flow --      Pain Score 11/06/15 1255 6     Pain Loc --      Pain Edu? --      Excl. in GC? --    No data found.   Updated Vital Signs BP 144/92 (BP Location: Left Arm)   Pulse 96   Temp 98.1 F (36.7 C) (Oral)   Wt 231 lb (104.8 kg)   SpO2 97%   BMI 36.18 kg/m   Visual Acuity Right Eye Distance:   Left Eye Distance:   Bilateral Distance:    Right Eye Near:   Left Eye Near:    Bilateral Near:     Physical Exam  Constitutional: He is oriented to person, place, and time. He  appears well-developed and well-nourished. No distress.  Pleasant male, no acute distress, but he appears uncomfortable from periumbilical abdominal pain. He is very overweight with abdomen with large amount of soft tissue.  HENT:  Head: Normocephalic and atraumatic.  Eyes: Pupils are equal, round, and reactive to light. No scleral icterus.  Neck: Normal range of motion. Neck supple.  Cardiovascular: Normal rate, regular rhythm and normal heart sounds.   Pulmonary/Chest: Effort normal and breath sounds normal.  Abdominal: Soft. Bowel sounds are normal. He exhibits no distension. There is no rigidity, no tenderness at McBurney's point and negative Murphy's sign.  Abdomen is obese, nondistended. Mild tenderness in the umbilical area and just inferior to the umbilicus in the soft tissue area. No definite hernia seen or palpated. No masses guarding or rebound. No hepatosplenomegaly  Neurological: He is alert and oriented to person, place, and time.  Skin: Skin is warm and dry.  Psychiatric: He has a normal mood and affect. His behavior is normal.     UC Treatments / Results  Labs (all labs ordered are listed, but only abnormal results are displayed) Labs Reviewed  POCT URINALYSIS DIP (MANUAL ENTRY)   Urinalysis today normal. No blood or evidence of infection. Discussed the option of CBC or other testing, but he declined any other testing at this time. EKG  EKG Interpretation None       Radiology No results found.  Procedures Procedures (including critical care time)  Medications Ordered in UC Medications - No data to display   Initial Impression / Assessment and Plan / UC Course  I have reviewed the triage vital signs and the nursing notes.  Pertinent labs & imaging results that were available during my care of the patient were reviewed by me and considered in my medical decision making (see chart for details).  Clinical Course    Clinically, no evidence of surgical  abdomen, no evidence of GU cause for his symptoms. On physical exam, he likely has abdominal wall muscle strain, but it's possible that he could have an occult abdominal wall hernia, although no signs or symptoms of obstruction or strangulation.   Final Clinical Impressions(s) / UC Diagnoses   Final diagnoses:  Abdominal wall strain, initial encounter   Treatment options discussed, as well as risks, benefits, alternatives. Patient voiced understanding and agreement with the following plans: Conservative treatment with rest and pain relief. Avoid straining for now. Advised to  follow up with PCP or surgeon within one week to evaluate further if symptoms persist.  An After Visit Summary was printed and given to the patient. Precautions discussed. Red flags discussed.--Emergency room if any red flag Questions invited and answered. He voiced understanding and agreement.  New Prescriptions Discharge Medication List as of 11/06/2015  1:59 PM    START taking these medications   Details  traMADol-acetaminophen (ULTRACET) 37.5-325 MG tablet Take 1-2 tablets by mouth every 6 (six) hours as needed for severe pain., Starting Fri 11/06/2015, Print         Lajean Manesavid Massey, MD 11/06/15 1750

## 2015-11-10 ENCOUNTER — Telehealth: Payer: Self-pay | Admitting: *Deleted

## 2015-11-10 NOTE — Telephone Encounter (Signed)
Callback: Pt reports his pain is much improved.

## 2015-12-15 ENCOUNTER — Emergency Department (HOSPITAL_BASED_OUTPATIENT_CLINIC_OR_DEPARTMENT_OTHER)
Admission: EM | Admit: 2015-12-15 | Discharge: 2015-12-15 | Disposition: A | Discharge status: Home / Self Care | Payer: Managed Care, Other (non HMO) | Attending: Emergency Medicine | Admitting: Emergency Medicine

## 2015-12-15 ENCOUNTER — Encounter (HOSPITAL_BASED_OUTPATIENT_CLINIC_OR_DEPARTMENT_OTHER): Payer: Self-pay | Admitting: *Deleted

## 2015-12-15 DIAGNOSIS — I10 Essential (primary) hypertension: Secondary | ICD-10-CM | POA: Insufficient documentation

## 2015-12-15 DIAGNOSIS — Z79899 Other long term (current) drug therapy: Secondary | ICD-10-CM | POA: Diagnosis not present

## 2015-12-15 DIAGNOSIS — M546 Pain in thoracic spine: Secondary | ICD-10-CM

## 2015-12-15 DIAGNOSIS — Z7982 Long term (current) use of aspirin: Secondary | ICD-10-CM | POA: Insufficient documentation

## 2015-12-15 MED ORDER — CYCLOBENZAPRINE HCL 10 MG PO TABS: 10.0000 mg | ORAL_TABLET | Freq: Two times a day (BID) | ORAL | 0 refills | Status: DC | PRN

## 2015-12-15 MED FILL — CYCLOBENZAPRINE 10 MG TAB: 10 | 7 days supply | Qty: 15 | Fill #0

## 2015-12-15 NOTE — ED Triage Notes (Addendum)
Upper back pain, dizziness for a few days. States pain is worse with sitting. Shakiness in his back makes him dizzy per pt. When he drives he feels dizzy and has a sensation that he might pass out. He is ambulatory.

## 2015-12-15 NOTE — Discharge Instructions (Signed)
Flexeril as prescribed as needed for pain.  Return to the emergency department if you develop worsening pain, numbness, tingling, chest pain, or other new and concerning symptoms.

## 2015-12-15 NOTE — ED Provider Notes (Signed)
[facility]   CSN: [id number] Arrival date & time: 12/15/15  1216     History   Chief Complaint Chief Complaint  Patient presents with  . Back Pain  . Dizziness    HPI Kevin Bailey is a 45 y.o. male.  Patient is a 45 year old male who presents with complaints of pain in his thoracic back and a sensation of dizziness. This began while he was driving. 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 While holding the steering wheel he developed pain in his back. He denies any chest pain or difficulty breathing. He denies any numbness or tingling.   The history is provided by the patient.  Back Pain   This is a new problem. The current episode started 6 to 12 hours ago. The problem occurs constantly. The problem has not changed since onset.The pain is associated with no known injury. The pain is present in the thoracic spine. The quality of the pain is described as aching. The pain does not radiate. The pain is moderate.  Dizziness    Past Medical History:  Diagnosis Date  . Acid reflux     Patient Active Problem List   Diagnosis Date Noted  . Costochondritis 01/12/2015  . Hyperlipidemia 05/08/2014  . Essential hypertension 04/04/2014    Past Surgical History:  Procedure Laterality Date  . WISDOM TOOTH EXTRACTION         Home Medications    Prior to Admission medications   Medication Sig Start Date End Date Taking? Authorizing Provider  aspirin 81 MG tablet Take 81 mg by mouth daily.    Historical Provider, MD  azelastine (ASTELIN) 0.1 % nasal spray Place 2 sprays into both nostrils 2 (two) times daily. 10/19/15   Lattie Haw, MD  cyclobenzaprine (FLEXERIL) 10 MG tablet Take 1 tablet (10 mg total) by mouth 2 (two) times daily as needed for muscle spasms. 12/15/15   Geoffery Lyons, MD  traMADol-acetaminophen (ULTRACET) 37.5-325 MG tablet Take 1-2 tablets by mouth every 6 (six) hours as needed for  severe pain. 11/06/15   Lajean Manes, MD    Family History Family History  Problem Relation Age of Onset  . Hypertension Father   . Anemia Father   . Diabetes      grandmother   . Diabetes Paternal Grandmother   . Heart disease Paternal Grandfather   . Neuropathy Neg Hx   . Neurofibromatosis Neg Hx   . Multiple sclerosis Neg Hx     Social History Social History  Substance Use Topics  . Smoking status: Never Smoker  . Smokeless tobacco: Never Used  . Alcohol use 1.2 oz/week    2 Standard drinks or equivalent per week     Allergies   Prednisone; Flonase [fluticasone propionate]; and Other   Review of Systems Review of Systems  Musculoskeletal: Positive for back pain.  Neurological: Positive for dizziness.  All other systems reviewed and are negative.    Physical Exam Updated Vital Signs BP 131/93 (BP Location: Right Arm)   Pulse 89   Temp 98.2 F (36.8 C) (Oral)   Resp 18   Ht 5\' 7"  (1.702 m)   Wt 225 lb (102.1 kg)   SpO2 95%   BMI 35.24 kg/m   Physical Exam  Constitutional: He is oriented to person, place, and time. He appears well-developed and well-nourished. No distress.  HENT:  Head: Normocephalic and atraumatic.  Mouth/Throat: Oropharynx is clear and  moist.  Neck: Normal range of motion. Neck supple.  Cardiovascular: Normal rate and regular rhythm.  Exam reveals no friction rub.   No murmur heard. Pulmonary/Chest: Effort normal and breath sounds normal. No respiratory distress. He has no wheezes. He has no rales.  Abdominal: Soft. Bowel sounds are normal. He exhibits no distension. There is no tenderness.  Musculoskeletal: Normal range of motion. He exhibits no edema.  There is tenderness to palpation in the soft tissues of the upper thoracic region. There is no bony tenderness or step-off.  Neurological: He is alert and oriented to person, place, and time. Coordination normal.  Skin: Skin is warm and dry. He is not diaphoretic.  Nursing note and  vitals reviewed.    ED Treatments / Results  Labs (all labs ordered are listed, but only abnormal results are displayed) Labs Reviewed - No data to display  EKG  EKG Interpretation  Date/Time:  Tuesday December 15 2015 12:33:05 EDT Ventricular Rate:  99 PR Interval:  128 QRS Duration: 84 QT Interval:  340 QTC Calculation: 436 R Axis:   36 Text Interpretation:  Normal sinus rhythm Normal ECG Confirmed by Hadrian Yarbrough  MD, Clifford Benninger ([id number]) on 12/15/2015 11:52:59 PM       Radiology No results found.  Procedures Procedures (including critical care time)  Medications Ordered in ED Medications - No data to display   Initial Impression / Assessment and Plan / ED Course  I have reviewed the triage vital signs and the nursing notes.  Pertinent labs & imaging results that were available during my care of the patient were reviewed by me and considered in my medical decision making (see chart for details).  Clinical Course    Physical examination is unremarkable and EKG is nondiagnostic. I highly doubt a cardiac etiology. His clinical presentation is somewhat confusing, however I doubt an emergent process. He has tenderness in the soft tissues of his back that I suspect is related to prolonged grasping of the steering wheel. He will be treated with muscle relaxers, rest, and when necessary follow-up if not improving.  Final Clinical Impressions(s) / ED Diagnoses   Final diagnoses:  Thoracic back pain, unspecified back pain laterality, unspecified chronicity    New Prescriptions Discharge Medication List as of 12/15/2015  3:23 PM    START taking these medications   Details  cyclobenzaprine (FLEXERIL) 10 MG tablet Take 1 tablet (10 mg total) by mouth 2 (two) times daily as needed for muscle spasms., Starting Tue 12/15/2015, Print         Geoffery Lyonsouglas Mazella Deen, MD 12/15/15 [phone]

## 2016-01-11 ENCOUNTER — Emergency Department
Admission: EM | Admit: 2016-01-11 | Discharge: 2016-01-11 | Disposition: A | Discharge status: Home / Self Care | Payer: Managed Care, Other (non HMO) | Source: Home / Self Care | Attending: Family Medicine | Admitting: Family Medicine

## 2016-01-11 ENCOUNTER — Encounter: Payer: Self-pay | Admitting: Emergency Medicine

## 2016-01-11 DIAGNOSIS — B9789 Other viral agents as the cause of diseases classified elsewhere: Secondary | ICD-10-CM

## 2016-01-11 DIAGNOSIS — J069 Acute upper respiratory infection, unspecified: Secondary | ICD-10-CM

## 2016-01-11 DIAGNOSIS — J302 Other seasonal allergic rhinitis: Secondary | ICD-10-CM

## 2016-01-11 HISTORY — DX: Chronic sinusitis, unspecified: J32.9

## 2016-01-11 MED ORDER — AMOXICILLIN 875 MG PO TABS: 875.0000 mg | ORAL_TABLET | Freq: Two times a day (BID) | ORAL | 0 refills | Status: DC

## 2016-01-11 NOTE — ED Triage Notes (Signed)
Reports sinus congestion, cough and right ear fullness for past 3-4- days. OTCs today.

## 2016-01-11 NOTE — ED Provider Notes (Signed)
[facility]    CSN: [id number] Arrival date & time: 01/11/16  1731     History   Chief Complaint Chief Complaint  Patient presents with  . Nasal Congestion  . Cough  . Ear Fullness    right    HPI Kevin Bailey is a 45 y.o. male.   Patient complains of one week history of typical cold-like symptoms developing over several days, including mild sore throat, sinus congestion, headache, fatigue, sweats, and cough.  He has a history of seasonal rhinitis and takes Singulair and Astelin spray.   The history is provided by the patient.    Past Medical History:  Diagnosis Date  . Acid reflux   . Sinus infection     Patient Active Problem List   Diagnosis Date Noted  . Costochondritis 01/12/2015  . Hyperlipidemia 05/08/2014  . Essential hypertension 04/04/2014    Past Surgical History:  Procedure Laterality Date  . WISDOM TOOTH EXTRACTION         Home Medications    Prior to Admission medications   Medication Sig Start Date End Date Taking? Authorizing Provider  amoxicillin (AMOXIL) 875 MG tablet Take 1 tablet (875 mg total) by mouth 2 (two) times daily. 01/11/16   Lattie HawStephen A Ranika Mcniel, MD  aspirin 81 MG tablet Take 81 mg by mouth daily.    Historical Provider, MD  azelastine (ASTELIN) 0.1 % nasal spray Place 2 sprays into both nostrils 2 (two) times daily. 10/19/15   Lattie HawStephen A Raylyn Speckman, MD  cyclobenzaprine (FLEXERIL) 10 MG tablet Take 1 tablet (10 mg total) by mouth 2 (two) times daily as needed for muscle spasms. 12/15/15   Geoffery Lyonsouglas Delo, MD  traMADol-acetaminophen (ULTRACET) 37.5-325 MG tablet Take 1-2 tablets by mouth every 6 (six) hours as needed for severe pain. 11/06/15   Lajean Manesavid Massey, MD    Family History Family History  Problem Relation Age of Onset  . Hypertension Father   . Anemia Father   . Diabetes      grandmother   . Diabetes Paternal Grandmother   . Heart disease Paternal Grandfather   . Neuropathy Neg Hx   . Neurofibromatosis  Neg Hx   . Multiple sclerosis Neg Hx     Social History Social History  Substance Use Topics  . Smoking status: Never Smoker  . Smokeless tobacco: Never Used  . Alcohol use 1.2 oz/week    2 Standard drinks or equivalent per week     Allergies   Prednisone; Flonase [fluticasone propionate]; and Other   Review of Systems Review of Systems     Physical Exam Triage Vital Signs ED Triage Vitals  Enc Vitals Group     BP 01/11/16 1751 143/89     Pulse Rate 01/11/16 1751 108     Resp 01/11/16 1751 16     Temp 01/11/16 1751 98.1 F (36.7 C)     Temp Source 01/11/16 1751 Oral     SpO2 01/11/16 1751 96 %     Weight 01/11/16 1751 234 lb (106.1 kg)     Height 01/11/16 1751 5\' 7"  (1.702 m)     Head Circumference --      Peak Flow --      Pain Score 01/11/16 1754 0     Pain Loc --      Pain Edu? --      Excl. in GC? --    No data found.   Updated Vital Signs BP 143/89 (BP Location: Left Arm)  Pulse 108   Temp 98.1 F (36.7 C) (Oral)   Resp 16   Ht 5\' 7"  (1.702 m)   Wt 234 lb (106.1 kg)   SpO2 96%   BMI 36.65 kg/m   Visual Acuity Right Eye Distance:   Left Eye Distance:   Bilateral Distance:    Right Eye Near:   Left Eye Near:    Bilateral Near:     Physical Exam Nursing notes and Vital Signs reviewed. Appearance:  Patient appears stated age, and in no acute distress Eyes:  Pupils are equal, round, and reactive to light and accomodation.  Extraocular movement is intact.  Conjunctivae are not inflamed  Ears:  Canals normal.  Tympanic membranes normal.  Nose:  Mildly congested turbinates.  No sinus tenderness.   Pharynx:  Normal Neck:  Supple.  Tender enlarged posterior/lateral nodes are palpated bilaterally  Lungs:  Clear to auscultation.  Breath sounds are equal.  Moving air well. Heart:  Regular rate and rhythm without murmurs, rubs, or gallops.  Abdomen:  Nontender without masses or hepatosplenomegaly.  Bowel sounds are present.  No CVA or flank  tenderness.  Extremities:  No edema.  Skin:  No rash present.    UC Treatments / Results  Labs (all labs ordered are listed, but only abnormal results are displayed) Labs Reviewed - No data to display  EKG  EKG Interpretation None       Radiology No results found.  Procedures Procedures (including critical care time)  Medications Ordered in UC Medications - No data to display   Initial Impression / Assessment and Plan / UC Course  I have reviewed the triage vital signs and the nursing notes.  Pertinent labs & imaging results that were available during my care of the patient were reviewed by me and considered in my medical decision making (see chart for details).  Clinical Course   With history of chronic rhinitis and recurring sinusitis, will begin amoxicillin. Take plain guaifenesin (1200mg  extended release tabs such as Mucinex) twice daily, with plenty of water, for cough and congestion.  Get adequate rest.   May use Afrin nasal spray (or generic oxymetazoline) twice daily for about 5 days and then discontinue.  Also recommend using saline nasal spray several times daily and saline nasal irrigation (AYR is a common brand).  May continue Astelin nasal spray. Continue Singulair. Try warm salt water gargles for sore throat.  Stop all antihistamines for now. May take Delsym Cough Suppressant at bedtime for nighttime cough.  Follow-up with family doctor if not improving about10 days.      Final Clinical Impressions(s) / UC Diagnoses   Final diagnoses:  Viral URI with cough  Seasonal allergic rhinitis, unspecified chronicity, unspecified trigger    New Prescriptions New Prescriptions   AMOXICILLIN (AMOXIL) 875 MG TABLET    Take 1 tablet (875 mg total) by mouth 2 (two) times daily.     Lattie HawStephen A Nyles Mitton, MD 01/14/16 [phone]

## 2016-01-11 NOTE — Discharge Instructions (Signed)
Take plain guaifenesin (1200mg  extended release tabs such as Mucinex) twice daily, with plenty of water, for cough and congestion.  Get adequate rest.   May use Afrin nasal spray (or generic oxymetazoline) twice daily for about 5 days and then discontinue.  Also recommend using saline nasal spray several times daily and saline nasal irrigation (AYR is a common brand).  May continue Astelin nasal spray. Continue Singulair. Try warm salt water gargles for sore throat.  Stop all antihistamines for now. May take Delsym Cough Suppressant at bedtime for nighttime cough.  Follow-up with family doctor if not improving about10 days.

## 2016-03-04 DIAGNOSIS — G8929 Other chronic pain: Secondary | ICD-10-CM | POA: Diagnosis not present

## 2016-03-04 DIAGNOSIS — M546 Pain in thoracic spine: Secondary | ICD-10-CM | POA: Diagnosis not present

## 2016-03-04 DIAGNOSIS — M542 Cervicalgia: Secondary | ICD-10-CM | POA: Diagnosis not present

## 2016-03-07 DIAGNOSIS — G8929 Other chronic pain: Secondary | ICD-10-CM | POA: Diagnosis not present

## 2016-03-07 DIAGNOSIS — M546 Pain in thoracic spine: Secondary | ICD-10-CM | POA: Diagnosis not present

## 2016-03-07 DIAGNOSIS — M542 Cervicalgia: Secondary | ICD-10-CM | POA: Diagnosis not present

## 2016-03-14 DIAGNOSIS — M546 Pain in thoracic spine: Secondary | ICD-10-CM | POA: Diagnosis not present

## 2016-03-14 DIAGNOSIS — G8929 Other chronic pain: Secondary | ICD-10-CM | POA: Diagnosis not present

## 2016-03-14 DIAGNOSIS — M542 Cervicalgia: Secondary | ICD-10-CM | POA: Diagnosis not present

## 2016-03-18 DIAGNOSIS — M542 Cervicalgia: Secondary | ICD-10-CM | POA: Diagnosis not present

## 2016-03-18 DIAGNOSIS — Z13220 Encounter for screening for lipoid disorders: Secondary | ICD-10-CM | POA: Diagnosis not present

## 2016-03-18 DIAGNOSIS — R42 Dizziness and giddiness: Secondary | ICD-10-CM | POA: Diagnosis not present

## 2016-03-18 DIAGNOSIS — G8929 Other chronic pain: Secondary | ICD-10-CM | POA: Diagnosis not present

## 2016-03-18 DIAGNOSIS — Z131 Encounter for screening for diabetes mellitus: Secondary | ICD-10-CM | POA: Diagnosis not present

## 2016-03-18 DIAGNOSIS — M546 Pain in thoracic spine: Secondary | ICD-10-CM | POA: Diagnosis not present

## 2016-03-18 DIAGNOSIS — Z0000 Encounter for general adult medical examination without abnormal findings: Secondary | ICD-10-CM | POA: Diagnosis not present

## 2016-03-21 DIAGNOSIS — M546 Pain in thoracic spine: Secondary | ICD-10-CM | POA: Diagnosis not present

## 2016-03-21 DIAGNOSIS — M542 Cervicalgia: Secondary | ICD-10-CM | POA: Diagnosis not present

## 2016-03-21 DIAGNOSIS — G8929 Other chronic pain: Secondary | ICD-10-CM | POA: Diagnosis not present

## 2016-03-25 DIAGNOSIS — M542 Cervicalgia: Secondary | ICD-10-CM | POA: Diagnosis not present

## 2016-03-25 DIAGNOSIS — M546 Pain in thoracic spine: Secondary | ICD-10-CM | POA: Diagnosis not present

## 2016-03-25 DIAGNOSIS — G8929 Other chronic pain: Secondary | ICD-10-CM | POA: Diagnosis not present

## 2016-03-28 DIAGNOSIS — G8929 Other chronic pain: Secondary | ICD-10-CM | POA: Diagnosis not present

## 2016-03-28 DIAGNOSIS — M546 Pain in thoracic spine: Secondary | ICD-10-CM | POA: Diagnosis not present

## 2016-03-28 DIAGNOSIS — M542 Cervicalgia: Secondary | ICD-10-CM | POA: Diagnosis not present

## 2016-04-01 DIAGNOSIS — G8929 Other chronic pain: Secondary | ICD-10-CM | POA: Diagnosis not present

## 2016-04-01 DIAGNOSIS — M542 Cervicalgia: Secondary | ICD-10-CM | POA: Diagnosis not present

## 2016-04-01 DIAGNOSIS — M546 Pain in thoracic spine: Secondary | ICD-10-CM | POA: Diagnosis not present

## 2016-04-04 DIAGNOSIS — M546 Pain in thoracic spine: Secondary | ICD-10-CM | POA: Diagnosis not present

## 2016-04-04 DIAGNOSIS — M542 Cervicalgia: Secondary | ICD-10-CM | POA: Diagnosis not present

## 2016-04-04 DIAGNOSIS — G8929 Other chronic pain: Secondary | ICD-10-CM | POA: Diagnosis not present

## 2016-04-11 DIAGNOSIS — J3489 Other specified disorders of nose and nasal sinuses: Secondary | ICD-10-CM | POA: Diagnosis not present

## 2016-04-18 DIAGNOSIS — M546 Pain in thoracic spine: Secondary | ICD-10-CM | POA: Diagnosis not present

## 2016-04-18 DIAGNOSIS — G8929 Other chronic pain: Secondary | ICD-10-CM | POA: Diagnosis not present

## 2016-04-18 DIAGNOSIS — M542 Cervicalgia: Secondary | ICD-10-CM | POA: Diagnosis not present

## 2016-08-08 DIAGNOSIS — G8929 Other chronic pain: Secondary | ICD-10-CM | POA: Diagnosis not present

## 2016-08-08 DIAGNOSIS — M898X1 Other specified disorders of bone, shoulder: Secondary | ICD-10-CM | POA: Diagnosis not present

## 2016-08-08 DIAGNOSIS — M546 Pain in thoracic spine: Secondary | ICD-10-CM | POA: Diagnosis not present

## 2016-08-08 DIAGNOSIS — M62838 Other muscle spasm: Secondary | ICD-10-CM | POA: Diagnosis not present

## 2016-09-19 DIAGNOSIS — M546 Pain in thoracic spine: Secondary | ICD-10-CM | POA: Diagnosis not present

## 2016-09-19 DIAGNOSIS — M62838 Other muscle spasm: Secondary | ICD-10-CM | POA: Diagnosis not present

## 2016-09-19 DIAGNOSIS — M898X1 Other specified disorders of bone, shoulder: Secondary | ICD-10-CM | POA: Diagnosis not present

## 2016-09-19 DIAGNOSIS — M542 Cervicalgia: Secondary | ICD-10-CM | POA: Diagnosis not present

## 2016-09-23 ENCOUNTER — Ambulatory Visit (INDEPENDENT_AMBULATORY_CARE_PROVIDER_SITE_OTHER): Payer: BLUE CROSS/BLUE SHIELD | Admitting: Primary Care

## 2016-09-23 ENCOUNTER — Encounter: Payer: Self-pay | Admitting: Primary Care

## 2016-09-23 DIAGNOSIS — G8929 Other chronic pain: Secondary | ICD-10-CM

## 2016-09-23 DIAGNOSIS — I10 Essential (primary) hypertension: Secondary | ICD-10-CM | POA: Diagnosis not present

## 2016-09-23 DIAGNOSIS — M542 Cervicalgia: Secondary | ICD-10-CM | POA: Insufficient documentation

## 2016-09-23 DIAGNOSIS — E785 Hyperlipidemia, unspecified: Secondary | ICD-10-CM

## 2016-09-23 DIAGNOSIS — M549 Dorsalgia, unspecified: Secondary | ICD-10-CM

## 2016-09-23 NOTE — Patient Instructions (Signed)
It's important to improve your diet by reducing consumption of fast food, fried food, processed snack foods, sugary drinks. Increase consumption of fresh vegetables and fruits, whole grains, water.  Ensure you are drinking 64 ounces of water daily.  Start exercising. You should be getting 150 minutes of moderate intensity exercise weekly.  Please schedule a physical with me in January 2019. You may also schedule a lab only appointment 3-4 days prior. We will discuss your lab results in detail during your physical.  It was a pleasure to meet you today! Please don't hesitate to call me with any questions. Welcome to [facility]!   High Cholesterol High cholesterol is a condition in which the blood has high levels of a white, waxy, fat-like substance (cholesterol). The human body needs small amounts of cholesterol. The liver makes all the cholesterol that the body needs. Extra (excess) cholesterol comes from the food that we eat. Cholesterol is carried from the liver by the blood through the blood vessels. If you have high cholesterol, deposits (plaques) may build up on the walls of your blood vessels (arteries). Plaques make the arteries narrower and stiffer. Cholesterol plaques increase your risk for heart attack and stroke. Work with your health care provider to keep your cholesterol levels in a healthy range. What increases the risk? This condition is more likely to develop in people who:  Eat foods that are high in animal fat (saturated fat) or cholesterol.  Are overweight.  Are not getting enough exercise.  Have a family history of high cholesterol.  What are the signs or symptoms? There are no symptoms of this condition. How is this diagnosed? This condition may be diagnosed from the results of a blood test.  If you are older than age 44, your health care provider may check your cholesterol every 4-6 years.  You may be checked more often if you already have high cholesterol or other  risk factors for heart disease.  The blood test for cholesterol measures:  "Bad" cholesterol (LDL cholesterol). This is the main type of cholesterol that causes heart disease. The desired level for LDL is less than 100.  "Good" cholesterol (HDL cholesterol). This type helps to protect against heart disease by cleaning the arteries and carrying the LDL away. The desired level for HDL is 60 or higher.  Triglycerides. These are fats that the body can store or burn for energy. The desired number for triglycerides is lower than 150.  Total cholesterol. This is a measure of the total amount of cholesterol in your blood, including LDL cholesterol, HDL cholesterol, and triglycerides. A healthy number is less than 200.  How is this treated? This condition is treated with diet changes, lifestyle changes, and medicines. Diet changes  This may include eating more whole grains, fruits, vegetables, nuts, and fish.  This may also include cutting back on red meat and foods that have a lot of added sugar. Lifestyle changes  Changes may include getting at least 40 minutes of aerobic exercise 3 times a week. Aerobic exercises include walking, biking, and swimming. Aerobic exercise along with a healthy diet can help you maintain a healthy weight.  Changes may also include quitting smoking. Medicines  Medicines are usually given if diet and lifestyle changes have failed to reduce your cholesterol to healthy levels.  Your health care provider may prescribe a statin medicine. Statin medicines have been shown to reduce cholesterol, which can reduce the risk of heart disease. Follow these instructions at home: Eating and drinking  If told by your health care provider:  Eat chicken (without skin), fish, veal, shellfish, ground Malaw[place]urkey breast, and round or loin cuts of red meat.  Do not eat fried foods or fatty meats, such as hot dogs and salami.  Eat plenty of fruits, such as apples.  Eat plenty of  vegetables, such as broccoli, potatoes, and carrots.  Eat beans, peas, and lentils.  Eat grains such as barley, rice, couscous, and bulgur wheat.  Eat pasta without cream sauces.  Use skim or nonfat milk, and eat low-fat or nonfat yogurt and cheeses.  Do not eat or drink whole milk, cream, ice cream, egg yolks, or hard cheeses.  Do not eat stick margarine or tub margarines that contain trans fats (also called partially hydrogenated oils).  Do not eat saturated tropical oils, such as coconut oil and palm oil.  Do not eat cakes, cookies, crackers, or other baked goods that contain trans fats.  General instructions  Exercise as directed by your health care provider. Increase your activity level with activities such as gardening, walking, and taking the stairs.  Take over-the-counter and prescription medicines only as told by your health care provider.  Do not use any products that contain nicotine or tobacco, such as cigarettes and e-cigarettes. If you need help quitting, ask your health care provider.  Keep all follow-up visits as told by your health care provider. This is important. Contact a health care provider if:  You are struggling to maintain a healthy diet or weight.  You need help to start on an exercise program.  You need help to stop smoking. Get help right away if:  You have chest pain.  You have trouble breathing. This information is not intended to replace advice given to you by your health care provider. Make sure you discuss any questions you have with your health care provider. Document Released: 02/07/2005 Document Revised: 09/05/2015 Document Reviewed: 08/08/2015 Elsevier Interactive Patient Education  2017 [employer]

## 2016-09-23 NOTE — Assessment & Plan Note (Signed)
Above goal today, also with prior visits. Home readings are borderline. Will have him work on weight loss through diet and exercise. He will continue to monitor and report readings at or above 130/80.

## 2016-09-23 NOTE — Assessment & Plan Note (Signed)
ASCVD risk sore of 1.7%. Will have him work on diet and regular exercise for weight loss. Recheck lipids in 6 months during his annual exam. Continue aspirin.

## 2016-09-23 NOTE — Assessment & Plan Note (Signed)
Following with orthopedics. Mostly MSK in nature. Overall improving.

## 2016-09-23 NOTE — Progress Notes (Signed)
Subjective:    Patient ID: Kevin Bailey, male    DOB: 07/09/1970, 46 y.o.   MRN: [id number]  HPI  Mr. Kevin Bailey is a 46 year old male who presents today to establish care and discuss the problems mentioned below. Will obtain old records.  1) Hyperlipidemia: Currently managed on aspirin 81 mg daily. Last lipid panel was above goal in January 2018. TC 225, LDL of 140. He has just recently started working on improvements in diet and regular exercise.  The 10-year ASCVD risk score Kevin Bailey(Kevin DC Montez HagemanJr., et al., 2013) is: 1.7%   Values used to calculate the score:     Age: [age] years     Sex: Male     Is Non-Hispanic African American: No     Diabetic: No     Tobacco smoker: No     Systolic Blood Pressure: 122 mmHg     Is BP treated: No     HDL Cholesterol: 42 mg/dL     Total Cholesterol: 178 mg/dL   2) Essential Hypertension: He checks his BP at home and gets readings of 130's/80's. He was once managed on HCTZ which caused nausea, feel bad. He denies chest pain, dizziness, shortness of breath.   Diet currently consists of:  Breakfast: Tomasa BlaseBacon Lunch: Fast food Dinner: Chicken, green beans, pasta, some restaurants  Snacks: Cheese Desserts: None Beverages: Water  Exercise: He is walking 30 minutes-1 hour daily.    BP Readings from Last 3 Encounters:  09/23/16 122/90  01/11/16 143/89  12/15/15 131/93   3) Chronic Back and Neck pain: Currently following with [facility]. Underwent physical therapy in the past. Currently follows every several months. He will take Advil as needed for pain and tightness. Mostly muscular involvement.  Review of Systems  Constitutional: Negative for fatigue.  Eyes: Negative for visual disturbance.  Respiratory: Negative for shortness of breath.   Cardiovascular: Negative for chest pain.  Musculoskeletal: Positive for arthralgias.       Following with orthopedics  Allergic/Immunologic: Positive for environmental allergies.  Neurological:  Negative for dizziness and headaches.       Past Medical History:  Diagnosis Date  . Acid reflux   . Heart murmur   . Hypertension   . Sinus infection      Social History   Social History  . Marital status: Married    Spouse name: Kevin Bailey  . Number of children: N/A  . Years of education: 2812   Occupational History  . [employer]    Social History Main Topics  . Smoking status: Never Smoker  . Smokeless tobacco: Never Used  . Alcohol use 1.2 oz/week    2 Standard drinks or equivalent per week  . Drug use: No  . Sexual activity: Yes    Partners: Female   Other Topics Concern  . Not on file   Social History Narrative   Lives with wife, Kevin Bailey   Caffeine use:  A little coffee/soda/tea    Past Surgical History:  Procedure Laterality Date  . WISDOM TOOTH EXTRACTION      Family History  Problem Relation Age of Onset  . Hypertension Father   . Anemia Father   . Diabetes Unknown        grandmother   . Diabetes Paternal Grandmother   . Heart disease Paternal Grandfather   . Neuropathy Neg Hx   . Neurofibromatosis Neg Hx   . Multiple sclerosis Neg Hx     Allergies  Allergen Reactions  .  Hydrochlorothiazide Other (See Comments)    Dizziness, felt weak, fatigued  . Prednisone Other (See Comments)    Hypotension  . Flonase [Fluticasone Propionate]     irritabile  . Other     "Any medicines with steroids in them"    Current Outpatient Prescriptions on File Prior to Visit  Medication Sig Dispense Refill  . aspirin 81 MG tablet Take 81 mg by mouth daily.     No current facility-administered medications on file prior to visit.     BP 122/90   Pulse 81   Ht 5\' 6"  (1.676 m)   Wt 235 lb (106.6 kg)   SpO2 98%   BMI 37.93 kg/m    Objective:   Physical Exam  Constitutional: He is oriented to person, place, and time. He appears well-nourished.  Neck: Neck supple.  Cardiovascular: Normal rate and regular rhythm.   Pulmonary/Chest: Effort normal  and breath sounds normal. He has no wheezes. He has no rales.  Neurological: He is alert and oriented to person, place, and time.  Skin: Skin is warm and dry.  Psychiatric: He has a normal mood and affect.          Assessment & Plan:

## 2016-10-11 DIAGNOSIS — M19011 Primary osteoarthritis, right shoulder: Secondary | ICD-10-CM | POA: Diagnosis not present

## 2016-10-11 DIAGNOSIS — M25511 Pain in right shoulder: Secondary | ICD-10-CM | POA: Diagnosis not present

## 2016-11-11 DIAGNOSIS — M19011 Primary osteoarthritis, right shoulder: Secondary | ICD-10-CM | POA: Diagnosis not present

## 2016-11-11 DIAGNOSIS — M25511 Pain in right shoulder: Secondary | ICD-10-CM | POA: Diagnosis not present

## 2016-11-11 DIAGNOSIS — M7521 Bicipital tendinitis, right shoulder: Secondary | ICD-10-CM | POA: Diagnosis not present

## 2016-11-11 DIAGNOSIS — R600 Localized edema: Secondary | ICD-10-CM | POA: Diagnosis not present

## 2016-11-21 DIAGNOSIS — M25511 Pain in right shoulder: Secondary | ICD-10-CM | POA: Diagnosis not present

## 2016-11-21 DIAGNOSIS — M7541 Impingement syndrome of right shoulder: Secondary | ICD-10-CM | POA: Diagnosis not present

## 2016-11-21 DIAGNOSIS — M19011 Primary osteoarthritis, right shoulder: Secondary | ICD-10-CM | POA: Diagnosis not present

## 2017-01-02 ENCOUNTER — Ambulatory Visit: Payer: Self-pay | Admitting: *Deleted

## 2017-01-02 ENCOUNTER — Encounter: Payer: Self-pay | Admitting: Family Medicine

## 2017-01-02 ENCOUNTER — Other Ambulatory Visit: Payer: Self-pay

## 2017-01-02 ENCOUNTER — Ambulatory Visit: Payer: BLUE CROSS/BLUE SHIELD | Admitting: Family Medicine

## 2017-01-02 VITALS — BP 110/80 | HR 97 | Temp 98.5°F | Ht 66.0 in | Wt 241.2 lb

## 2017-01-02 DIAGNOSIS — J0100 Acute maxillary sinusitis, unspecified: Secondary | ICD-10-CM

## 2017-01-02 MED ORDER — AMOXICILLIN 500 MG PO CAPS: 1000.0000 mg | ORAL_CAPSULE | Freq: Two times a day (BID) | ORAL | 0 refills | Status: AC

## 2017-01-02 MED ORDER — AMOXICILLIN 500 MG PO CAPS: 1000.0000 mg | ORAL_CAPSULE | Freq: Two times a day (BID) | ORAL | 0 refills | Status: DC

## 2017-01-02 NOTE — Telephone Encounter (Signed)
Tobi Bastosnna from [facility] called to clarify an Amoxicillin Rx written by Dr. Patsy Lageropland if he wanted 10 days or 21 days.   I called Rena the nurse at Dr. Cyndie Chimeopland's office.   She checked with him and he wants  10 day supply.   I made Tobi Bastosnna aware.

## 2017-01-02 NOTE — Addendum Note (Signed)
Addended by: Damita LackLORING, Zenita Kister S on: 01/02/2017 08:59 AM   Modules accepted: Orders

## 2017-01-02 NOTE — Progress Notes (Signed)
Dr. Karleen HampshireSpencer T. Jakwon Gayton, MD, CAQ Sports [facility] [address] [place], [address] Phone: [phone] Fax: [phone]  01/02/2017  Patient: Kevin Bailey, MRN: [id number], DOB: 06/20/1970, 46 y.o.  Primary Physician:  Doreene Nestlark, Katherine K, NP   Chief Complaint  Patient presents with  . Sinusitis   Subjective:   This 46 y.o. male patient presents with runny nose, sneezing, cough, sore throat, malaise and minimal / low-grade fever for > 1 week. Now the primary complaint has become sinus pressure and pain behind the eyes and in the upper, anterior face.   Sinus, fever. Had some dizziness. Max pain and in the throat.   The patent denies sore throat as the primary complaint. Denies sthortness of breath/wheezing, high fever, chest pain, significant myalgia, otalgia, abdominal pain, changes in bowel or bladder.  PMH, PHS, Allergies, Problem List, Medications, Family History, and Social History have all been reviewed.  Patient Active Problem List   Diagnosis Date Noted  . Chronic neck and back pain 09/23/2016  . Costochondritis 01/12/2015  . Hyperlipidemia 05/08/2014  . Essential hypertension 04/04/2014    Past Medical History:  Diagnosis Date  . Acid reflux   . Heart murmur   . Hyperlipidemia   . Hypertension   . Sinus infection     Past Surgical History:  Procedure Laterality Date  . WISDOM TOOTH EXTRACTION      Social History   Socioeconomic History  . Marital status: Married    Spouse name: [name]  . Number of children: Not on file  . Years of education: 312  . Highest education level: Not on file  Social Needs  . Financial resource strain: Not on file  . Food insecurity - worry: Not on file  . Food insecurity - inability: Not on file  . Transportation needs - medical: Not on file  . Transportation needs - non-medical: Not on file  Occupational History  . 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  Smoking status: Never Smoker  . Smokeless tobacco: Never Used  Substance and Sexual Activity  . Alcohol use: Yes    Alcohol/week: 1.2 oz    Types: 2 Standard drinks or equivalent per week  . Drug use: No  . Sexual activity: Yes    Partners: Female  Other Topics Concern  . Not on file  Social History Narrative   Lives with wife, [name]   No children.   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   Enjoys cars.     Family History  Problem Relation Age of Onset  . Hypertension Father   . Anemia Father   . Diabetes Unknown        grandmother   . Diabetes Paternal Grandmother   . Heart disease Paternal Grandfather   . Neuropathy Neg Hx   . Neurofibromatosis Neg Hx   . Multiple sclerosis Neg Hx     Allergies  Allergen Reactions  . Hydrochlorothiazide Other (See Comments)    Dizziness, felt weak, fatigued  . Prednisone Other (See Comments)    Hypotension  . Flonase [Fluticasone Propionate]     irritabile  . Other     "Any medicines with steroids in them"    Medication list reviewed and updated in full in [facility].  ROS as above, eating and drinking - tolerating PO. Urinating normally. No excessive vomitting or diarrhea. O/w as above.  Objective:  Blood pressure 110/80, pulse 97, temperature 98.5 F (36.9 C), temperature source Oral, height 5\' 6"  (1.676 m), weight 241 lb 4 oz (109.4 kg).  GEN: WDWN, Non-toxic, Atraumatic, normocephalic. A and O x 3. HEENT: Oropharynx clear without exudate, MMM, no significant LAD, mild rhinnorhea Sinuses: Right Frontal, ethmoid, and maxillary: Tender max Left Frontal, Ethmoid, and maxillary: Tender max Ears: TM clear, COL visualized with good landmarks CV: RRR, no m/g/r. Pulm: CTA B, no wheezes, rhonchi, or crackles, normal respiratory effort. EXT: no c/c/e Psych: well oriented, neither depressed nor anxious in appearance  Assessment and Plan:   Acute non-recurrent maxillary  sinusitis  Acute sinusitis: ABX as below.   Reviewed symptomatic care as well as ABX in this case.   Follow-up: No Follow-up on file.  Signed,  Elpidio GaleaSpencer T. Maxey Ransom, MD    Medication List        Accurate as of 01/02/17  8:30 AM. Always use your most recent med list.          amoxicillin 500 MG capsule Commonly known as:  AMOXIL Take 2 capsules (1,000 mg total) 2 (two) times daily for 21 days by mouth.   aspirin 81 MG tablet   ibuprofen 200 MG tablet Commonly known as:  ADVIL,MOTRIN       Where to Get Your Medications    [id number] [place] [place] - [address]  [address], [place] [id number]   Phone:  [phone]   amoxicillin 500 MG capsule

## 2017-02-16 DIAGNOSIS — M47812 Spondylosis without myelopathy or radiculopathy, cervical region: Secondary | ICD-10-CM | POA: Diagnosis not present

## 2017-02-16 DIAGNOSIS — M5030 Other cervical disc degeneration, unspecified cervical region: Secondary | ICD-10-CM | POA: Diagnosis not present

## 2017-02-16 DIAGNOSIS — M62838 Other muscle spasm: Secondary | ICD-10-CM | POA: Diagnosis not present

## 2017-02-16 DIAGNOSIS — M542 Cervicalgia: Secondary | ICD-10-CM | POA: Diagnosis not present

## 2017-03-17 ENCOUNTER — Other Ambulatory Visit: Payer: Self-pay | Admitting: Primary Care

## 2017-03-17 DIAGNOSIS — I10 Essential (primary) hypertension: Secondary | ICD-10-CM

## 2017-03-17 DIAGNOSIS — E785 Hyperlipidemia, unspecified: Secondary | ICD-10-CM

## 2017-03-24 ENCOUNTER — Other Ambulatory Visit (INDEPENDENT_AMBULATORY_CARE_PROVIDER_SITE_OTHER): Payer: BLUE CROSS/BLUE SHIELD

## 2017-03-24 DIAGNOSIS — E785 Hyperlipidemia, unspecified: Secondary | ICD-10-CM

## 2017-03-24 DIAGNOSIS — I10 Essential (primary) hypertension: Secondary | ICD-10-CM

## 2017-03-24 LAB — LIPID PANEL
CHOL/HDL RATIO: 4
Cholesterol: 205 mg/dL — ABNORMAL HIGH (ref 0–200)
HDL: 56.1 mg/dL (ref >39.00)
LDL Cholesterol: 129 mg/dL — ABNORMAL HIGH (ref 0–99)
NONHDL: 149.11
Triglycerides: 100 mg/dL (ref 0.0–149.0)
VLDL: 20 mg/dL (ref 0.0–40.0)

## 2017-03-24 LAB — COMPREHENSIVE METABOLIC PANEL
ALT: 25 U/L (ref 0–53)
AST: 23 U/L (ref 0–37)
Albumin: 4.5 g/dL (ref 3.5–5.2)
Alkaline Phosphatase: 40 U/L (ref 39–117)
BUN: 16 mg/dL (ref 6–23)
CO2: 29 meq/L (ref 19–32)
Calcium: 9.8 mg/dL (ref 8.4–10.5)
Chloride: 102 mEq/L (ref 96–112)
Creatinine, Ser: 0.76 mg/dL (ref 0.40–1.50)
GFR: 118.06 mL/min (ref >60.00)
GLUCOSE: 109 mg/dL — AB (ref 70–99)
POTASSIUM: 4.5 meq/L (ref 3.5–5.1)
SODIUM: 138 meq/L (ref 135–145)
TOTAL PROTEIN: 7.4 g/dL (ref 6.0–8.3)
Total Bilirubin: 0.4 mg/dL (ref 0.2–1.2)

## 2017-03-31 ENCOUNTER — Ambulatory Visit (INDEPENDENT_AMBULATORY_CARE_PROVIDER_SITE_OTHER): Payer: BLUE CROSS/BLUE SHIELD | Admitting: Primary Care

## 2017-03-31 ENCOUNTER — Encounter: Payer: Self-pay | Admitting: Primary Care

## 2017-03-31 VITALS — BP 124/82 | HR 90 | Temp 98.2°F | Ht 66.0 in | Wt 242.2 lb

## 2017-03-31 DIAGNOSIS — Z0000 Encounter for general adult medical examination without abnormal findings: Secondary | ICD-10-CM

## 2017-03-31 DIAGNOSIS — I10 Essential (primary) hypertension: Secondary | ICD-10-CM | POA: Diagnosis not present

## 2017-03-31 DIAGNOSIS — R739 Hyperglycemia, unspecified: Secondary | ICD-10-CM

## 2017-03-31 DIAGNOSIS — E785 Hyperlipidemia, unspecified: Secondary | ICD-10-CM

## 2017-03-31 LAB — HEMOGLOBIN A1C: HEMOGLOBIN A1C: 5.6 % (ref 4.6–6.5)

## 2017-03-31 NOTE — Assessment & Plan Note (Signed)
Stable in the office today, continue to monitor.  

## 2017-03-31 NOTE — Assessment & Plan Note (Signed)
Td due, declines. Influenza UTD. Discussed the importance of a healthy diet and regular exercise in order for weight loss, and to reduce the risk of any potential medical problems. Exam unremarkable. Labs with hyperlipidemia and hyperglycemia, check A1C today. Repeat lipids in 6 months. Follow up in 1 year.

## 2017-03-31 NOTE — Assessment & Plan Note (Signed)
Slightly too high. Strongly recommended improvement in diet and to start exercising. Repeat labs in 6 months.

## 2017-03-31 NOTE — Patient Instructions (Signed)
Stop by the lab prior to leaving today. I will notify you of your results once received.   Start exercising. You should be getting 150 minutes of moderate intensity exercise weekly.  Increase vegetables, fruit, whole grains, lean protein. Limit fast food/fried food/fatty food. Limit alcohol.  Schedule a lab only appointment in 6 months to repeat your cholesterol.  Follow up in 1 year for your annual exam or sooner if needed.  It was a pleasure to see you today!    Preventive Care 40-64 Years, Male Preventive care refers to lifestyle choices and visits with your health care provider that can promote health and wellness. What does preventive care include?  A yearly physical exam. This is also called an annual well check.  Dental exams once or twice a year.  Routine eye exams. Ask your health care provider how often you should have your eyes checked.  Personal lifestyle choices, including: ? Daily care of your teeth and gums. ? Regular physical activity. ? Eating a healthy diet. ? Avoiding tobacco and drug use. ? Limiting alcohol use. ? Practicing safe sex. ? Taking low-dose aspirin every day starting at age 45. What happens during an annual well check? The services and screenings done by your health care provider during your annual well check will depend on your age, overall health, lifestyle risk factors, and family history of disease. Counseling Your health care provider may ask you questions about your:  Alcohol use.  Tobacco use.  Drug use.  Emotional well-being.  Home and relationship well-being.  Sexual activity.  Eating habits.  Work and work Statistician.  Screening You may have the following tests or measurements:  Height, weight, and BMI.  Blood pressure.  Lipid and cholesterol levels. These may be checked every 5 years, or more frequently if you are over 61 years old.  Skin check.  Lung cancer screening. You may have this screening every year  starting at age 45 if you have a 30-pack-year history of smoking and currently smoke or have quit within the past 15 years.  Fecal occult blood test (FOBT) of the stool. You may have this test every year starting at age 45.  Flexible sigmoidoscopy or colonoscopy. You may have a sigmoidoscopy every 5 years or a colonoscopy every 10 years starting at age 45.  Prostate cancer screening. Recommendations will vary depending on your family history and other risks.  Hepatitis C blood test.  Hepatitis B blood test.  Sexually transmitted disease (STD) testing.  Diabetes screening. This is done by checking your blood sugar (glucose) after you have not eaten for a while (fasting). You may have this done every 1-3 years.  Discuss your test results, treatment options, and if necessary, the need for more tests with your health care provider. Vaccines Your health care provider may recommend certain vaccines, such as:  Influenza vaccine. This is recommended every year.  Tetanus, diphtheria, and acellular pertussis (Tdap, Td) vaccine. You may need a Td booster every 10 years.  Varicella vaccine. You may need this if you have not been vaccinated.  Zoster vaccine. You may need this after age 42.  Measles, mumps, and rubella (MMR) vaccine. You may need at least one dose of MMR if you were born in 1957 or later. You may also need a second dose.  Pneumococcal 13-valent conjugate (PCV13) vaccine. You may need this if you have certain conditions and have not been vaccinated.  Pneumococcal polysaccharide (PPSV23) vaccine. You may need one or two doses if  you smoke cigarettes or if you have certain conditions.  Meningococcal vaccine. You may need this if you have certain conditions.  Hepatitis A vaccine. You may need this if you have certain conditions or if you travel or work in places where you may be exposed to hepatitis A.  Hepatitis B vaccine. You may need this if you have certain conditions or if  you travel or work in places where you may be exposed to hepatitis B.  Haemophilus influenzae type b (Hib) vaccine. You may need this if you have certain risk factors.  Talk to your health care provider about which screenings and vaccines you need and how often you need them. This information is not intended to replace advice given to you by your health care provider. Make sure you discuss any questions you have with your health care provider. Document Released: 03/06/2015 Document Revised: 10/28/2015 Document Reviewed: 12/09/2014 Elsevier Interactive Patient Education  [employer].

## 2017-03-31 NOTE — Progress Notes (Signed)
Subjective:    Patient ID: Kevin Bailey, male    DOB: 06/26/1970, 47 y.o.   MRN: [id number]  HPI  Kevin Bailey is a 47 year old male who presents today for complete physical.  Immunizations: -Tetanus: Unsure, believes it's been over 10 years  -Influenza: Completed this season    Diet: He endorses a fair diet. Breakfast: Tomasa BlaseBacon, oatmeal Lunch: Fast food Dinner: Fast food Snacks: Cheese, nuts Desserts: Occasionally  Beverages: Water, daily beer  Exercise: He does not currently exercise, sometimes walk Eye exam: Completed in 2018 Dental exam: Completes semi-annually   Review of Systems  Constitutional: Negative for unexpected weight change.  HENT: Negative for rhinorrhea.   Respiratory: Negative for cough and shortness of breath.   Cardiovascular: Negative for chest pain.  Gastrointestinal: Negative for constipation and diarrhea.  Genitourinary: Negative for difficulty urinating.  Musculoskeletal: Negative for arthralgias and myalgias.  Skin: Negative for rash.  Allergic/Immunologic: Positive for environmental allergies.  Neurological: Negative for dizziness, numbness and headaches.  Psychiatric/Behavioral: The patient is not nervous/anxious.        Past Medical History:  Diagnosis Date  . Acid reflux   . Heart murmur   . Hyperlipidemia   . Hypertension   . Sinus infection      Social History   Socioeconomic History  . Marital status: Married    Spouse name: Kevin Bailey  . Number of children: Not on file  . Years of education: 3512  . Highest education level: Not on file  Social Needs  . Financial resource strain: Not on file  . Food insecurity - worry: Not on file  . Food insecurity - inability: Not on file  . Transportation needs - medical: Not on file  . Transportation needs - non-medical: Not on file  Occupational History  . 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 Smoking status: Never Smoker  . Smokeless tobacco: Never Used  Substance  and Sexual Activity  . Alcohol use: Yes    Alcohol/week: 1.2 oz    Types: 2 Standard drinks or equivalent per week  . Drug use: No  . Sexual activity: Yes    Partners: Female  Other Topics Concern  . Not on file  Social History Narrative   Lives with wife, Kevin Bailey   No children.   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   Enjoys cars.     Past Surgical History:  Procedure Laterality Date  . WISDOM TOOTH EXTRACTION      Family History  Problem Relation Age of Onset  . Hypertension Father   . Anemia Father   . Diabetes Unknown        grandmother   . Diabetes Paternal Grandmother   . Heart disease Paternal Grandfather   . Neuropathy Neg Hx   . Neurofibromatosis Neg Hx   . Multiple sclerosis Neg Hx     Allergies  Allergen Reactions  . Hydrochlorothiazide Other (See Comments)    Dizziness, felt weak, fatigued  . Prednisone Other (See Comments)    Hypotension  . Flonase [Fluticasone Propionate]     irritabile  . Other     "Any medicines with steroids in them"    Current Outpatient Medications on File Prior to Visit  Medication Sig Dispense Refill  . aspirin 81 MG tablet Take 81 mg by mouth daily.    . fluticasone (FLONASE) 50 MCG/ACT nasal spray Place 2 sprays into both nostrils daily.    .Marland Kitchen  ibuprofen (ADVIL,MOTRIN) 200 MG tablet Take 200 mg by mouth every 6 (six) hours as needed.     No current facility-administered medications on file prior to visit.     BP 124/82   Pulse 90   Temp 98.2 F (36.8 C) (Oral)   Ht 5\' 6"  (1.676 m)   Wt 242 lb 4 oz (109.9 kg)   SpO2 97%   BMI 39.10 kg/m    Objective:   Physical Exam  Constitutional: He is oriented to person, place, and time. He appears well-nourished.  HENT:  Right Ear: Tympanic membrane and ear canal normal.  Left Ear: Tympanic membrane and ear canal normal.  Nose: Nose normal. Right sinus exhibits no maxillary sinus tenderness and no frontal sinus tenderness.  Left sinus exhibits no maxillary sinus tenderness and no frontal sinus tenderness.  Mouth/Throat: Oropharynx is clear and moist.  Eyes: Conjunctivae and EOM are normal. Pupils are equal, round, and reactive to light.  Neck: Neck supple. Carotid bruit is not present. No thyromegaly present.  Cardiovascular: Normal rate, regular rhythm and normal heart sounds.  Pulmonary/Chest: Effort normal and breath sounds normal. He has no wheezes. He has no rales.  Abdominal: Soft. Bowel sounds are normal. There is no tenderness.  Musculoskeletal: Normal range of motion.  Neurological: He is alert and oriented to person, place, and time. He has normal reflexes. No cranial nerve deficit.  Skin: Skin is warm and dry.  Psychiatric: He has a normal mood and affect.          Assessment & Plan:

## 2017-05-26 DIAGNOSIS — J343 Hypertrophy of nasal turbinates: Secondary | ICD-10-CM | POA: Diagnosis not present

## 2017-05-26 DIAGNOSIS — J329 Chronic sinusitis, unspecified: Secondary | ICD-10-CM | POA: Diagnosis not present

## 2017-06-02 DIAGNOSIS — J329 Chronic sinusitis, unspecified: Secondary | ICD-10-CM | POA: Diagnosis not present

## 2017-08-30 DIAGNOSIS — H43313 Vitreous membranes and strands, bilateral: Secondary | ICD-10-CM | POA: Diagnosis not present

## 2018-01-31 ENCOUNTER — Ambulatory Visit: Payer: BLUE CROSS/BLUE SHIELD | Admitting: Family Medicine

## 2018-01-31 ENCOUNTER — Encounter: Payer: Self-pay | Admitting: Family Medicine

## 2018-01-31 VITALS — BP 144/80 | HR 99 | Temp 98.0°F | Ht 66.0 in | Wt 239.0 lb

## 2018-01-31 DIAGNOSIS — J302 Other seasonal allergic rhinitis: Secondary | ICD-10-CM | POA: Diagnosis not present

## 2018-01-31 MED ORDER — MOMETASONE FUROATE 50 MCG/ACT NA SUSP: 2.0000 | Freq: Every day | NASAL | 12 refills | Status: DC

## 2018-01-31 NOTE — Patient Instructions (Addendum)
Try some Xyzol instead of Claritin for 2 weeks  Use saline nasal spry 3-5 times a day Use Afrin type nasal spray sparingly- one spray in each nostril 1-2 times a day if tolerated Consider seeing Allergy specialist if continued problems  Allergic Rhinitis, Adult Allergic rhinitis is an allergic reaction that affects the mucous membrane inside the nose. It causes sneezing, a runny or stuffy nose, and the feeling of mucus going down the back of the throat (postnasal drip). Allergic rhinitis can be mild to severe. There are two types of allergic rhinitis:  Seasonal. This type is also called hay fever. It happens only during certain seasons.  Perennial. This type can happen at any time of the year.  What are the causes? This condition happens when the body's defense system (immune system) responds to certain harmless substances called allergens as though they were germs.  Seasonal allergic rhinitis is triggered by pollen, which can come from grasses, trees, and weeds. Perennial allergic rhinitis may be caused by:  House dust mites.  Pet dander.  Mold spores.  What are the signs or symptoms? Symptoms of this condition include:  Sneezing.  Runny or stuffy nose (nasal congestion).  Postnasal drip.  Itchy nose.  Tearing of the eyes.  Trouble sleeping.  Daytime sleepiness.  How is this diagnosed? This condition may be diagnosed based on:  Your medical history.  A physical exam.  Tests to check for related conditions, such as: ? Asthma. ? Pink eye. ? Ear infection. ? Upper respiratory infection.  Tests to find out which allergens trigger your symptoms. These may include skin or blood tests.  How is this treated? There is no cure for this condition, but treatment can help control symptoms. Treatment may include:  Taking medicines that block allergy symptoms, such as antihistamines. Medicine may be given as a shot, nasal spray, or pill.  Avoiding the  allergen.  Desensitization. This treatment involves getting ongoing shots until your body becomes less sensitive to the allergen. This treatment may be done if other treatments do not help.  If taking medicine and avoiding the allergen does not work, new, stronger medicines may be prescribed.  Follow these instructions at home:  Find out what you are allergic to. Common allergens include smoke, dust, and pollen.  Avoid the things you are allergic to. These are some things you can do to help avoid allergens: ? Replace carpet with wood, tile, or vinyl flooring. Carpet can trap dander and dust. ? Do not smoke. Do not allow smoking in your home. ? Change your heating and air conditioning filter at least once a month. ? During allergy season:  Keep windows closed as much as possible.  Plan outdoor activities when pollen counts are lowest. This is usually during the evening hours.  When coming indoors, change clothing and shower before sitting on furniture or bedding.  Take over-the-counter and prescription medicines only as told by your health care provider.  Keep all follow-up visits as told by your health care provider. This is important. Contact a health care provider if:  You have a fever.  You develop a persistent cough.  You make whistling sounds when you breathe (you wheeze).  Your symptoms interfere with your normal daily activities. Get help right away if:  You have shortness of breath. Summary  This condition can be managed by taking medicines as directed and avoiding allergens.  Contact your health care provider if you develop a persistent cough or fever.  During allergy  season, keep windows closed as much as possible. This information is not intended to replace advice given to you by your health care provider. Make sure you discuss any questions you have with your health care provider. Document Released: 11/02/2000 Document Revised: 03/17/2016 Document Reviewed:  03/17/2016 Elsevier Interactive Patient Education  [employer]2018 Else[employer] Inc[employer]

## 2018-01-31 NOTE — Progress Notes (Signed)
Subjective:    Patient ID: Kevin Bailey, male    DOB: 07/05/1970, 47 y.o.   MRN: [id number]  HPI This is a 47 yo male who presents today with nasal congestion for several weeks. Has had more nasal congestion, eyes feel puffy. Uses occasional decongestant, Sensamist every other day. Off and on antihistamine every other day. Thick white nasal drainage, little headache. Some ear pressure, no cough or SOB. No fever. Doesn't feel bad, symptoms aggravating.  Has seen ENT and allergy in past. Serum allergy testing positive for "everything." He declined skin testing.   Past Medical History:  Diagnosis Date  . Acid reflux   . Heart murmur   . Hyperlipidemia   . Hypertension   . Sinus infection    Past Surgical History:  Procedure Laterality Date  . WISDOM TOOTH EXTRACTION     Family History  Problem Relation Age of Onset  . Hypertension Father   . Anemia Father   . Diabetes Unknown        grandmother   . Diabetes Paternal Grandmother   . Heart disease Paternal Grandfather   . Neuropathy Neg Hx   . Neurofibromatosis Neg Hx   . Multiple sclerosis Neg Hx    Social History   Tobacco Use  . Smoking status: Never Smoker  . Smokeless tobacco: Never Used  Substance Use Topics  . Alcohol use: Yes    Alcohol/week: 2.0 standard drinks    Types: 2 Standard drinks or equivalent per week  . Drug use: No      Review of Systems    per HPI Objective:   Physical Exam  Constitutional: He is oriented to person, place, and time. He appears well-developed and well-nourished. No distress.  HENT:  Head: Normocephalic and atraumatic.  Right Ear: Tympanic membrane, external ear and ear canal normal.  Left Ear: Tympanic membrane, external ear and ear canal normal.  Nose: Nose normal. Right sinus exhibits no maxillary sinus tenderness and no frontal sinus tenderness. Left sinus exhibits no maxillary sinus tenderness and no frontal sinus tenderness.  Mouth/Throat: Uvula is midline,  oropharynx is clear and moist and mucous membranes are normal.  Eyes: Conjunctivae are normal.  Neck: Normal range of motion. Neck supple.  Cardiovascular: Normal rate, regular rhythm and normal heart sounds.  Pulmonary/Chest: Effort normal and breath sounds normal.  Lymphadenopathy:    He has no cervical adenopathy.  Neurological: He is alert and oriented to person, place, and time.  Skin: Skin is warm and dry. He is not diaphoretic.  Psychiatric: He has a normal mood and affect. His behavior is normal. Judgment and thought content normal.  Vitals reviewed.     BP (!) 144/80 (BP Location: Right Arm, Patient Position: Sitting, Cuff Size: Large)   Pulse 99   Temp 98 F (36.7 C) (Oral)   Ht 5\' 6"  (1.676 m)   Wt 239 lb (108.4 kg)   SpO2 97%   BMI 38.58 kg/m      BP Readings from Last 3 Encounters:  01/31/18 (!) 144/80  03/31/17 124/82  01/02/17 110/80    Assessment & Plan:  1. Seasonal allergic rhinitis, unspecified trigger - Provided written and verbal information regarding diagnosis and treatment. - will try to change up his therapy a little, switch to Xyzol, change fluticasone to mometasone, encouraged him to use daily, can use short term nasal decongestant prior to intranasal steroid. He takes meds every other day, symptoms should improve with daily treatment, he is very sensitive  to medications. Also encouraged him to use saline nasal spray 3-5 times a day, especially after being outside. Discussed Mucinex usage to thin secretions but he states he is intolerant.  - RTC precautions reviewed - mometasone (NASONEX) 50 MCG/ACT nasal spray; Place 2 sprays into the nose daily.  Dispense: 17 g; Refill: 12- reviewed instructions for usage   Olean Ree, FNP-BC  [facility], [facility]  01/31/2018 8:28 AM

## 2018-02-02 DIAGNOSIS — H5213 Myopia, bilateral: Secondary | ICD-10-CM | POA: Diagnosis not present

## 2018-04-20 ENCOUNTER — Encounter: Payer: BLUE CROSS/BLUE SHIELD | Admitting: Family Medicine

## 2018-04-20 ENCOUNTER — Ambulatory Visit (INDEPENDENT_AMBULATORY_CARE_PROVIDER_SITE_OTHER): Payer: BLUE CROSS/BLUE SHIELD | Admitting: Primary Care

## 2018-04-20 ENCOUNTER — Encounter: Payer: Self-pay | Admitting: Primary Care

## 2018-04-20 VITALS — BP 140/82 | HR 98 | Temp 98.2°F | Ht 66.0 in | Wt 240.8 lb

## 2018-04-20 DIAGNOSIS — E785 Hyperlipidemia, unspecified: Secondary | ICD-10-CM

## 2018-04-20 DIAGNOSIS — I10 Essential (primary) hypertension: Secondary | ICD-10-CM | POA: Diagnosis not present

## 2018-04-20 DIAGNOSIS — M542 Cervicalgia: Secondary | ICD-10-CM

## 2018-04-20 DIAGNOSIS — M25511 Pain in right shoulder: Secondary | ICD-10-CM | POA: Diagnosis not present

## 2018-04-20 DIAGNOSIS — G8929 Other chronic pain: Secondary | ICD-10-CM

## 2018-04-20 DIAGNOSIS — J309 Allergic rhinitis, unspecified: Secondary | ICD-10-CM | POA: Diagnosis not present

## 2018-04-20 DIAGNOSIS — Z0000 Encounter for general adult medical examination without abnormal findings: Secondary | ICD-10-CM

## 2018-04-20 DIAGNOSIS — M549 Dorsalgia, unspecified: Secondary | ICD-10-CM

## 2018-04-20 DIAGNOSIS — M25519 Pain in unspecified shoulder: Secondary | ICD-10-CM

## 2018-04-20 MED ORDER — LISINOPRIL 10 MG PO TABS: 10.0000 mg | ORAL_TABLET | Freq: Every day | ORAL | 0 refills | Status: DC

## 2018-04-20 MED ORDER — DICLOFENAC SODIUM 75 MG PO TBEC: 75.0000 mg | DELAYED_RELEASE_TABLET | Freq: Two times a day (BID) | ORAL | 0 refills | Status: DC | PRN

## 2018-04-20 NOTE — Progress Notes (Signed)
Subjective:    Patient ID: Kevin Bailey, male    DOB: January 17, 1971, 47 y.o.   MRN: [id number]  HPI  Kevin Bailey is a 48 year old male who presents today for complete physical. He also endorses chronic arthralgias to neck and right shoulder. Elevated blood pressure readings.   Immunizations: -Tetanus: Declines  -Influenza: Completed this season   Diet: He endorses a fair diet Breakfast: Tomasa Blase Lunch: Fast food Dinner: Chicken, red meat, starches, some vegetables  Snacks: Cheese Desserts: None Beverages: Water, beer  Exercise: He is not exercising Eye exam: Completed in 2019 Dental exam: Completes annually   BP Readings from Last 3 Encounters:  04/20/18 140/82  01/31/18 (!) 144/80  03/31/17 124/82   He has not been checking his BP at home. He was once managed on HCTZ in the past (around 2015) which made him feel dizzy and "sick". He is on the road for prolonged periods of time and cannot stop to use the bathroom.   Review of Systems  Constitutional: Negative for unexpected weight change.  HENT: Negative for rhinorrhea.   Respiratory: Negative for cough and shortness of breath.   Cardiovascular: Negative for chest pain.  Gastrointestinal: Negative for constipation and diarrhea.  Genitourinary: Negative for difficulty urinating.  Musculoskeletal: Positive for arthralgias and back pain. Negative for myalgias.       Chronic shoulder pain  Skin: Negative for rash.  Allergic/Immunologic: Positive for environmental allergies.  Neurological: Negative for numbness and headaches.  Psychiatric/Behavioral: The patient is not nervous/anxious.        Past Medical History:  Diagnosis Date  . Acid reflux   . Costochondritis 01/12/2015  . Heart murmur   . Hyperlipidemia   . Hypertension   . Sinus infection      Social History   Socioeconomic History  . Marital status: Married    Spouse name: [name]  . Number of children: Not on file  . Years of education: 24  .  Highest education level: Not on file  Occupational History  . Occupation: Marketing executive Needs  . Financial resource strain: Not on file  . Food insecurity:    Worry: Not on file    Inability: Not on file  . Transportation needs:    Medical: Not on file    Non-medical: Not on file  Tobacco Use  . Smoking status: Never Smoker  . Smokeless tobacco: Never Used  Substance and Sexual Activity  . Alcohol use: Yes    Alcohol/week: 2.0 standard drinks    Types: 2 Standard drinks or equivalent per week  . Drug use: No  . Sexual activity: Yes    Partners: Female  Lifestyle  . Physical activity:    Days per week: Not on file    Minutes per session: Not on file  . Stress: Not on file  Relationships  . Social connections:    Talks on phone: Not on file    Gets together: Not on file    Attends religious service: Not on file    Active member of club or organization: Not on file    Attends meetings of clubs or organizations: Not on file    Relationship status: Not on file  . Intimate partner violence:    Fear of current or ex partner: Not on file    Emotionally abused: Not on file    Physically abused: Not on file    Forced sexual activity: Not on file  Other Topics Concern  .  Not on file  Social History Narrative   Lives with wife, [name]   No children.   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     Past Surgical History:  Procedure Laterality Date  . WISDOM TOOTH EXTRACTION      Family History  Problem Relation Age of Onset  . Hypertension Father   . Anemia Father   . Diabetes Unknown        grandmother   . Diabetes Paternal Grandmother   . Heart disease Paternal Grandfather   . Neuropathy Neg Hx   . Neurofibromatosis Neg Hx   . Multiple sclerosis Neg Hx     Allergies  Allergen Reactions  . Hydrochlorothiazide Other (See Comments)    Dizziness, felt weak, fatigued  . Prednisone Other (See  Comments)    Hypotension  . Flonase [Fluticasone Propionate]     irritabile  . Other     "Any medicines with steroids in them"    Current Outpatient Medications on File Prior to Visit  Medication Sig Dispense Refill  . aspirin 81 MG tablet Take 81 mg by mouth daily.    . fluticasone (FLONASE) 50 MCG/ACT nasal spray Place 2 sprays into both nostrils daily.    Marland Kitchen ibuprofen (ADVIL,MOTRIN) 200 MG tablet Take 200 mg by mouth every 6 (six) hours as needed.     No current facility-administered medications on file prior to visit.     BP 140/82   Pulse 98   Temp 98.2 F (36.8 C) (Oral)   Ht 5\' 6"  (1.676 m)   Wt 240 lb 12 oz (109.2 kg)   SpO2 96%   BMI 38.86 kg/m    Objective:   Physical Exam  Constitutional: He is oriented to person, place, and time. He appears well-nourished.  HENT:  Mouth/Throat: No oropharyngeal exudate.  Eyes: Pupils are equal, round, and reactive to light. EOM are normal.  Neck: Neck supple. No thyromegaly present.  Cardiovascular: Normal rate and regular rhythm.  Respiratory: Effort normal and breath sounds normal.  GI: Soft. Bowel sounds are normal. There is no abdominal tenderness.  Musculoskeletal: Normal range of motion.  Neurological: He is alert and oriented to person, place, and time.  Skin: Skin is warm and dry.  Psychiatric: He has a normal mood and affect.           Assessment & Plan:

## 2018-04-20 NOTE — Assessment & Plan Note (Signed)
Tetanus due, declines. Influenza UTD. Discussed the importance of a healthy diet and regular exercise in order for weight loss, and to reduce the risk of any potential medical problems. Exam unremarkable. Labs pending, he will return when fasting. Follow up in year for CPE.

## 2018-04-20 NOTE — Assessment & Plan Note (Signed)
Doing Flonase and Xyzal and is overall doing well.  Continue same.

## 2018-04-20 NOTE — Assessment & Plan Note (Signed)
Repeat lipids pending, he will return when fasting. Discussed the importance of a healthy diet and regular exercise in order for weight loss, and to reduce the risk of any potential medical problems.

## 2018-04-20 NOTE — Assessment & Plan Note (Signed)
Evaluated by orthopedics and sports medicine in the past. Will trial diclofenac ER 75 mg BID PRN. Discussed to stop other NSAID's. He will update.

## 2018-04-20 NOTE — Assessment & Plan Note (Addendum)
Above goal in the office today, also on prior visits that date back to 2017. Could also be from continued NSAID use for chronic arthralgias, also with obesity.  Given continued elevated readings, will treat. Rx for lisinopril 10 mg sent to pharmacy.  Discussed to work on weight loss through diet and exercise. Follow up in 2-3 weeks for BMP and BP check.

## 2018-04-20 NOTE — Assessment & Plan Note (Addendum)
Evaluated by orthopedics in the past, told he has arthritis. Has also been through PT.  He is taking Advil 200 mg 2-3 times daily with improvement, does have intermittent increased pain.  Will trial diclofenac ER 75 mg BID. Discussed to stop NSAID's with diclofenac use.  He will update.

## 2018-04-20 NOTE — Patient Instructions (Signed)
Start lisinopril 10 mg tablets for blood pressure. Take 1 tablet by mouth once daily.  You can try the diclofenac medication twice daily as needed for pain. Do not take Ibuprofen, Advil, Motrin, Aleve, naproxen with this medication.  Start exercising. You should be getting 150 minutes of moderate intensity exercise weekly.  It's important to improve your diet by reducing consumption of fast food, fried food, processed snack foods, sugary drinks. Increase consumption of fresh vegetables and fruits, whole grains, water.  Ensure you are drinking 64 ounces of water daily.  Start monitoring your blood pressure daily, around the same time of day, for the next 2-3weeks.  Ensure that you have rested for 30 minutes prior to checking your blood pressure. Record your readings and bring them to your next visit.  Schedule a follow up visit for 2-3 weeks for blood pressure check.  It was a pleasure to see you today!   Preventive Care 40-64 Years, Male Preventive care refers to lifestyle choices and visits with your health care provider that can promote health and wellness. What does preventive care include?   A yearly physical exam. This is also called an annual well check.  Dental exams once or twice a year.  Routine eye exams. Ask your health care provider how often you should have your eyes checked.  Personal lifestyle choices, including: ? Daily care of your teeth and gums. ? Regular physical activity. ? Eating a healthy diet. ? Avoiding tobacco and drug use. ? Limiting alcohol use. ? Practicing safe sex. ? Taking low-dose aspirin every day starting at age 11. What happens during an annual well check? The services and screenings done by your health care provider during your annual well check will depend on your age, overall health, lifestyle risk factors, and family history of disease. Counseling Your health care provider may ask you questions about your:  Alcohol use.  Tobacco  use.  Drug use.  Emotional well-being.  Home and relationship well-being.  Sexual activity.  Eating habits.  Work and work Statistician. Screening You may have the following tests or measurements:  Height, weight, and BMI.  Blood pressure.  Lipid and cholesterol levels. These may be checked every 5 years, or more frequently if you are over 46 years old.  Skin check.  Lung cancer screening. You may have this screening every year starting at age 80 if you have a 30-pack-year history of smoking and currently smoke or have quit within the past 15 years.  Colorectal cancer screening. All adults should have this screening starting at age 68 and continuing until age 39. Your health care provider may recommend screening at age 40. You will have tests every 1-10 years, depending on your results and the type of screening test. People at increased risk should start screening at an earlier age. Screening tests may include: ? Guaiac-based fecal occult blood testing. ? Fecal immunochemical test (FIT). ? Stool DNA test. ? Virtual colonoscopy. ? Sigmoidoscopy. During this test, a flexible tube with a tiny camera (sigmoidoscope) is used to examine your rectum and lower colon. The sigmoidoscope is inserted through your anus into your rectum and lower colon. ? Colonoscopy. During this test, a long, thin, flexible tube with a tiny camera (colonoscope) is used to examine your entire colon and rectum.  Prostate cancer screening. Recommendations will vary depending on your family history and other risks.  Hepatitis C blood test.  Hepatitis B blood test.  Sexually transmitted disease (STD) testing.  Diabetes screening. This is  done by checking your blood sugar (glucose) after you have not eaten for a while (fasting). You may have this done every 1-3 years. Discuss your test results, treatment options, and if necessary, the need for more tests with your health care provider. Vaccines Your health  care provider may recommend certain vaccines, such as:  Influenza vaccine. This is recommended every year.  Tetanus, diphtheria, and acellular pertussis (Tdap, Td) vaccine. You may need a Td booster every 10 years.  Varicella vaccine. You may need this if you have not been vaccinated.  Zoster vaccine. You may need this after age 11.  Measles, mumps, and rubella (MMR) vaccine. You may need at least one dose of MMR if you were born in 1957 or later. You may also need a second dose.  Pneumococcal 13-valent conjugate (PCV13) vaccine. You may need this if you have certain conditions and have not been vaccinated.  Pneumococcal polysaccharide (PPSV23) vaccine. You may need one or two doses if you smoke cigarettes or if you have certain conditions.  Meningococcal vaccine. You may need this if you have certain conditions.  Hepatitis A vaccine. You may need this if you have certain conditions or if you travel or work in places where you may be exposed to hepatitis A.  Hepatitis B vaccine. You may need this if you have certain conditions or if you travel or work in places where you may be exposed to hepatitis B.  Haemophilus influenzae type b (Hib) vaccine. You may need this if you have certain risk factors. Talk to your health care provider about which screenings and vaccines you need and how often you need them. This information is not intended to replace advice given to you by your health care provider. Make sure you discuss any questions you have with your health care provider. Document Released: 03/06/2015 Document Revised: 03/30/2017 Document Reviewed: 12/09/2014 Elsevier Interactive Patient Education  2019 [employer].

## 2018-04-27 DIAGNOSIS — L918 Other hypertrophic disorders of the skin: Secondary | ICD-10-CM | POA: Diagnosis not present

## 2018-04-27 DIAGNOSIS — L0291 Cutaneous abscess, unspecified: Secondary | ICD-10-CM | POA: Diagnosis not present

## 2018-04-27 DIAGNOSIS — D229 Melanocytic nevi, unspecified: Secondary | ICD-10-CM | POA: Diagnosis not present

## 2018-05-11 ENCOUNTER — Other Ambulatory Visit: Payer: Self-pay

## 2018-05-11 ENCOUNTER — Ambulatory Visit: Payer: BLUE CROSS/BLUE SHIELD | Admitting: Primary Care

## 2018-05-11 VITALS — BP 130/78 | HR 88 | Temp 98.0°F | Wt 238.0 lb

## 2018-05-11 DIAGNOSIS — I10 Essential (primary) hypertension: Secondary | ICD-10-CM

## 2018-05-11 DIAGNOSIS — E785 Hyperlipidemia, unspecified: Secondary | ICD-10-CM

## 2018-05-11 LAB — LIPID PANEL
Cholesterol: 209 mg/dL — ABNORMAL HIGH (ref 0–200)
HDL: 44.3 mg/dL (ref >39.00)
LDL CALC: 141 mg/dL — AB (ref 0–99)
NONHDL: 164.37
Total CHOL/HDL Ratio: 5
Triglycerides: 116 mg/dL (ref 0.0–149.0)
VLDL: 23.2 mg/dL (ref 0.0–40.0)

## 2018-05-11 LAB — COMPREHENSIVE METABOLIC PANEL
ALT: 21 U/L (ref 0–53)
AST: 23 U/L (ref 0–37)
Albumin: 4.7 g/dL (ref 3.5–5.2)
Alkaline Phosphatase: 39 U/L (ref 39–117)
BUN: 16 mg/dL (ref 6–23)
CHLORIDE: 102 meq/L (ref 96–112)
CO2: 28 meq/L (ref 19–32)
Calcium: 9.5 mg/dL (ref 8.4–10.5)
Creatinine, Ser: 0.8 mg/dL (ref 0.40–1.50)
GFR: 104.17 mL/min (ref >60.00)
GLUCOSE: 99 mg/dL (ref 70–99)
POTASSIUM: 4.5 meq/L (ref 3.5–5.1)
SODIUM: 138 meq/L (ref 135–145)
Total Bilirubin: 0.6 mg/dL (ref 0.2–1.2)
Total Protein: 7.4 g/dL (ref 6.0–8.3)

## 2018-05-11 MED ORDER — LISINOPRIL 10 MG PO TABS: 10.0000 mg | ORAL_TABLET | Freq: Every day | ORAL | 3 refills | Status: DC

## 2018-05-11 NOTE — Addendum Note (Signed)
Addended by: Doreene Nest on: 05/11/2018 07:32 AM   Modules accepted: Orders

## 2018-05-11 NOTE — Progress Notes (Signed)
Subjective:    Patient ID: Kevin Bailey, male    DOB: 01/10/1971, 48 y.o.   MRN: [id number]  HPI  Kevin Bailey is a 48 year old male who presents today for follow up of hypertension.  He was last evaulated on 04/20/18 with several elevated BP readings dating back to 2017. He was not on treatment at that time, also endorsed chronic NSAID use. Given elevated readings he was initiated on lisinopril 10 mg daily and encouraged to follow up today.  Since his last visit he's been checking his BP at home which is running 120-130/70's-80's. He denies chest pain, dizziness, cough.   BP Readings from Last 3 Encounters:  05/11/18 130/78  04/20/18 140/82  01/31/18 (!) 144/80     Review of Systems  Eyes: Negative for visual disturbance.  Respiratory: Negative for shortness of breath.   Cardiovascular: Negative for chest pain.  Neurological: Negative for dizziness and headaches.        Past Medical History:  Diagnosis Date  . Acid reflux   . Costochondritis 01/12/2015  . Heart murmur   . Hyperlipidemia   . Hypertension   . Sinus infection      Social History   Socioeconomic History  . Marital status: Married    Spouse name: Kevin Bailey  . Number of children: Not on file  . Years of education: 37  . Highest education level: Not on file  Occupational History  . Occupation: Marketing executive Needs  . Financial resource strain: Not on file  . Food insecurity:    Worry: Not on file    Inability: Not on file  . Transportation needs:    Medical: Not on file    Non-medical: Not on file  Tobacco Use  . Smoking status: Never Smoker  . Smokeless tobacco: Never Used  Substance and Sexual Activity  . Alcohol use: Yes    Alcohol/week: 2.0 standard drinks    Types: 2 Standard drinks or equivalent per week  . Drug use: No  . Sexual activity: Yes    Partners: Female  Lifestyle  . Physical activity:    Days per week: Not on file    Minutes per session: Not on file   . Stress: Not on file  Relationships  . Social connections:    Talks on phone: Not on file    Gets together: Not on file    Attends religious service: Not on file    Active member of club or organization: Not on file    Attends meetings of clubs or organizations: Not on file    Relationship status: Not on file  . Intimate partner violence:    Fear of current or ex partner: Not on file    Emotionally abused: Not on file    Physically abused: Not on file    Forced sexual activity: Not on file  Other Topics Concern  . Not on file  Social History Narrative   Lives with wife, Kevin Bailey   No children.   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     Past Surgical History:  Procedure Laterality Date  . WISDOM TOOTH EXTRACTION      Family History  Problem Relation Age of Onset  . Hypertension Father   . Anemia Father   . Diabetes Unknown        grandmother   . Diabetes Paternal Grandmother   . Heart  disease Paternal Grandfather   . Neuropathy Neg Hx   . Neurofibromatosis Neg Hx   . Multiple sclerosis Neg Hx     Allergies  Allergen Reactions  . Hydrochlorothiazide Other (See Comments)    Dizziness, felt weak, fatigued  . Prednisone Other (See Comments)    Hypotension  . Flonase [Fluticasone Propionate]     irritabile  . Other     "Any medicines with steroids in them"    Current Outpatient Medications on File Prior to Visit  Medication Sig Dispense Refill  . aspirin 81 MG tablet Take 81 mg by mouth daily.    . fluticasone (FLONASE) 50 MCG/ACT nasal spray Place 2 sprays into both nostrils daily.    Marland Kitchen ibuprofen (ADVIL,MOTRIN) 200 MG tablet Take 200 mg by mouth every 6 (six) hours as needed.    Marland Kitchen lisinopril (PRINIVIL,ZESTRIL) 10 MG tablet Take 1 tablet (10 mg total) by mouth daily. For blood pressure. 30 tablet 0   No current facility-administered medications on file prior to visit.     BP 130/78   Pulse 88    Temp 98 F (36.7 C) (Oral)   Wt 238 lb (108 kg)   SpO2 99%   BMI 38.41 kg/m    Objective:   Physical Exam  Constitutional: He appears well-nourished.  Neck: Neck supple.  Cardiovascular: Normal rate and regular rhythm.  Respiratory: Effort normal and breath sounds normal.  Skin: Skin is warm and dry.           Assessment & Plan:

## 2018-05-11 NOTE — Patient Instructions (Signed)
Continue taking lisinopril 10 mg tablets for high blood pressure.   Stop by the lab prior to leaving today. I will notify you of your results once received.   It was a pleasure to see you today!

## 2018-05-11 NOTE — Assessment & Plan Note (Signed)
Improved in the office today on lisinopril 10 mg.  Continue same, refills sent to pharmacy. BMP pending.

## 2019-04-22 ENCOUNTER — Encounter: Payer: BLUE CROSS/BLUE SHIELD | Admitting: Primary Care

## 2019-04-26 ENCOUNTER — Encounter: Payer: Self-pay | Admitting: Primary Care

## 2019-04-26 ENCOUNTER — Ambulatory Visit (INDEPENDENT_AMBULATORY_CARE_PROVIDER_SITE_OTHER): Payer: BC Managed Care – PPO | Admitting: Primary Care

## 2019-04-26 ENCOUNTER — Other Ambulatory Visit: Payer: Self-pay

## 2019-04-26 VITALS — BP 136/80 | HR 95 | Temp 97.0°F | Ht 66.0 in | Wt 244.0 lb

## 2019-04-26 DIAGNOSIS — M542 Cervicalgia: Secondary | ICD-10-CM

## 2019-04-26 DIAGNOSIS — Z0000 Encounter for general adult medical examination without abnormal findings: Secondary | ICD-10-CM

## 2019-04-26 DIAGNOSIS — M25511 Pain in right shoulder: Secondary | ICD-10-CM | POA: Diagnosis not present

## 2019-04-26 DIAGNOSIS — E785 Hyperlipidemia, unspecified: Secondary | ICD-10-CM | POA: Diagnosis not present

## 2019-04-26 DIAGNOSIS — I10 Essential (primary) hypertension: Secondary | ICD-10-CM

## 2019-04-26 DIAGNOSIS — J309 Allergic rhinitis, unspecified: Secondary | ICD-10-CM

## 2019-04-26 DIAGNOSIS — G8929 Other chronic pain: Secondary | ICD-10-CM

## 2019-04-26 DIAGNOSIS — M549 Dorsalgia, unspecified: Secondary | ICD-10-CM

## 2019-04-26 NOTE — Patient Instructions (Signed)
Start monitoring your blood pressure daily, around the same time of day, for the next 2-3 weeks.  Ensure that you have rested for 30 minutes prior to checking your blood pressure. Record your readings and notify me if you see readings consistently at or above 135/90.  It's important to improve your diet by reducing consumption of fast food, fried food, processed snack foods, sugary drinks. Increase consumption of fresh vegetables and fruits, whole grains, water.  Ensure you are drinking 64 ounces of water daily.  Start exercising. You should be getting 150 minutes of moderate intensity exercise weekly.  Think about the colonoscopy.  Schedule lab only appointment for your fasting labs.  It was a pleasure to see you today!   Preventive Care 16-3 Years Old, Male Preventive care refers to lifestyle choices and visits with your health care provider that can promote health and wellness. This includes:  A yearly physical exam. This is also called an annual well check.  Regular dental and eye exams.  Immunizations.  Screening for certain conditions.  Healthy lifestyle choices, such as eating a healthy diet, getting regular exercise, not using drugs or products that contain nicotine and tobacco, and limiting alcohol use. What can I expect for my preventive care visit? Physical exam Your health care provider will check:  Height and weight. These may be used to calculate body mass index (BMI), which is a measurement that tells if you are at a healthy weight.  Heart rate and blood pressure.  Your skin for abnormal spots. Counseling Your health care provider may ask you questions about:  Alcohol, tobacco, and drug use.  Emotional well-being.  Home and relationship well-being.  Sexual activity.  Eating habits.  Work and work Statistician. What immunizations do I need?  Influenza (flu) vaccine  This is recommended every year. Tetanus, diphtheria, and pertussis (Tdap)  vaccine  You may need a Td booster every 10 years. Varicella (chickenpox) vaccine  You may need this vaccine if you have not already been vaccinated. Zoster (shingles) vaccine  You may need this after age 79. Measles, mumps, and rubella (MMR) vaccine  You may need at least one dose of MMR if you were born in 1957 or later. You may also need a second dose. Pneumococcal conjugate (PCV13) vaccine  You may need this if you have certain conditions and were not previously vaccinated. Pneumococcal polysaccharide (PPSV23) vaccine  You may need one or two doses if you smoke cigarettes or if you have certain conditions. Meningococcal conjugate (MenACWY) vaccine  You may need this if you have certain conditions. Hepatitis A vaccine  You may need this if you have certain conditions or if you travel or work in places where you may be exposed to hepatitis A. Hepatitis B vaccine  You may need this if you have certain conditions or if you travel or work in places where you may be exposed to hepatitis B. Haemophilus influenzae type b (Hib) vaccine  You may need this if you have certain risk factors. Human papillomavirus (HPV) vaccine  If recommended by your health care provider, you may need three doses over 6 months. You may receive vaccines as individual doses or as more than one vaccine together in one shot (combination vaccines). Talk with your health care provider about the risks and benefits of combination vaccines. What tests do I need? Blood tests  Lipid and cholesterol levels. These may be checked every 5 years, or more frequently if you are over 20 years old.  Hepatitis C test.  Hepatitis B test. Screening  Lung cancer screening. You may have this screening every year starting at age 29 if you have a 30-pack-year history of smoking and currently smoke or have quit within the past 15 years.  Prostate cancer screening. Recommendations will vary depending on your family history  and other risks.  Colorectal cancer screening. All adults should have this screening starting at age 64 and continuing until age 38. Your health care provider may recommend screening at age 5 if you are at increased risk. You will have tests every 1-10 years, depending on your results and the type of screening test.  Diabetes screening. This is done by checking your blood sugar (glucose) after you have not eaten for a while (fasting). You may have this done every 1-3 years.  Sexually transmitted disease (STD) testing. Follow these instructions at home: Eating and drinking  Eat a diet that includes fresh fruits and vegetables, whole grains, lean protein, and low-fat dairy products.  Take vitamin and mineral supplements as recommended by your health care provider.  Do not drink alcohol if your health care provider tells you not to drink.  If you drink alcohol: ? Limit how much you have to 0-2 drinks a day. ? Be aware of how much alcohol is in your drink. In the [place] one drink equals one 12 oz bottle of beer (355 mL), one 5 oz glass of wine (148 mL), or one 1 oz glass of hard liquor (44 mL). Lifestyle  Take daily care of your teeth and gums.  Stay active. Exercise for at least 30 minutes on 5 or more days each week.  Do not use any products that contain nicotine or tobacco, such as cigarettes, e-cigarettes, and chewing tobacco. If you need help quitting, ask your health care provider.  If you are sexually active, practice safe sex. Use a condom or other form of protection to prevent STIs (sexually transmitted infections).  Talk with your health care provider about taking a low-dose aspirin every day starting at age 50. What's next?  Go to your health care provider once a year for a well check visit.  Ask your health care provider how often you should have your eyes and teeth checked.  Stay up to date on all vaccines. This information is not intended to replace advice given to you  by your health care provider. Make sure you discuss any questions you have with your health care provider. Document Revised: 02/01/2018 Document Reviewed: 02/01/2018 Elsevier Patient Education  2020 [employer].

## 2019-04-26 NOTE — Progress Notes (Signed)
Subjective:    Patient ID: Kevin Bailey, male    DOB: 1970/09/25, 49 y.o.   MRN: [id number]  HPI  This visit occurred during the SARS-CoV-2 public health emergency.  Safety protocols were in place, including screening questions prior to the visit, additional usage of staff PPE, and extensive cleaning of exam room while observing appropriate contact time as indicated for disinfecting solutions.   Kevin Bailey is a 49 year old male who presents today for complete physical.  Immunizations: -Tetanus: Unsure, over 10 years, declines  -Influenza: Completed this season   Diet: He endorses a poor diet. Exercise: He is walking some, no regular exercise.   Eye exam: Due next week Dental exam: Completes annually   Colonoscopy: Declines, he will think about and get back to me.  BP Readings from Last 3 Encounters:  04/26/19 140/80  05/11/18 130/78  04/20/18 140/82   He is not checking his BP at home. He denies chest pain, dizziness, headaches.    Review of Systems  Constitutional: Negative for unexpected weight change.  HENT: Negative for rhinorrhea.   Respiratory: Negative for cough and shortness of breath.   Cardiovascular: Negative for chest pain.  Gastrointestinal: Negative for constipation and diarrhea.  Genitourinary: Negative for difficulty urinating.  Musculoskeletal: Negative for arthralgias and myalgias.  Skin: Negative for rash.  Allergic/Immunologic: Positive for environmental allergies.  Neurological: Negative for dizziness, numbness and headaches.  Psychiatric/Behavioral: The patient is not nervous/anxious.        Past Medical History:  Diagnosis Date  . Acid reflux   . Costochondritis 01/12/2015  . Heart murmur   . Hyperlipidemia   . Hypertension   . Sinus infection      Social History   Socioeconomic History  . Marital status: Married    Spouse name: Kevin Bailey  . Number of children: Not on file  . Years of education: 85  . Highest education  level: Not on file  Occupational History  . Occupation: Financial planner  Tobacco Use  . Smoking status: Never Smoker  . Smokeless tobacco: Never Used  Substance and Sexual Activity  . Alcohol use: Yes    Alcohol/week: 2.0 standard drinks    Types: 2 Standard drinks or equivalent per week  . Drug use: No  . Sexual activity: Yes    Partners: Female  Other Topics Concern  . Not on file  Social History Narrative   Lives with wife, Kevin Bailey   No children.   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    Social Determinants of Health   Financial Resource Strain:   . Difficulty of Paying Living Expenses: Not on file  Food Insecurity:   . Worried About Programme researcher, broadcasting/film/video in the Last Year: Not on file  . Ran Out of Food in the Last Year: Not on file  Transportation Needs:   . Lack of Transportation (Medical): Not on file  . Lack of Transportation (Non-Medical): Not on file  Physical Activity:   . Days of Exercise per Week: Not on file  . Minutes of Exercise per Session: Not on file  Stress:   . Feeling of Stress : Not on file  Social Connections:   . Frequency of Communication with Friends and Family: Not on file  . Frequency of Social Gatherings with Friends and Family: Not on file  . Attends Religious Services: Not on file  . Active Member of  Clubs or Organizations: Not on file  . Attends Archivist Meetings: Not on file  . Marital Status: Not on file  Intimate Partner Violence:   . Fear of Current or Ex-Partner: Not on file  . Emotionally Abused: Not on file  . Physically Abused: Not on file  . Sexually Abused: Not on file    Past Surgical History:  Procedure Laterality Date  . WISDOM TOOTH EXTRACTION      Family History  Problem Relation Age of Onset  . Hypertension Father   . Anemia Father   . Diabetes Unknown        grandmother   . Diabetes Paternal Grandmother   . Heart disease Paternal  Grandfather   . Neuropathy Neg Hx   . Neurofibromatosis Neg Hx   . Multiple sclerosis Neg Hx     Allergies  Allergen Reactions  . Hydrochlorothiazide Other (See Comments)    Dizziness, felt weak, fatigued  . Prednisone Other (See Comments)    Hypotension  . Flonase [Fluticasone Propionate]     irritabile  . Other     "Any medicines with steroids in them"    Current Outpatient Medications on File Prior to Visit  Medication Sig Dispense Refill  . aspirin 81 MG tablet Take 81 mg by mouth daily.    . fluticasone (FLONASE) 50 MCG/ACT nasal spray Place 2 sprays into both nostrils daily.    Marland Kitchen ibuprofen (ADVIL,MOTRIN) 200 MG tablet Take 200 mg by mouth every 6 (six) hours as needed.    Marland Kitchen lisinopril (PRINIVIL,ZESTRIL) 10 MG tablet Take 1 tablet (10 mg total) by mouth daily. For blood pressure. 90 tablet 3  . loratadine (CLARITIN) 10 MG tablet Take 10 mg by mouth daily.     No current facility-administered medications on file prior to visit.    BP 140/80   Pulse 95   Temp (!) 97 F (36.1 C) (Temporal)   Ht 5\' 6"  (1.676 m)   Wt 244 lb (110.7 kg)   SpO2 98%   BMI 39.38 kg/m    Objective:   Physical Exam  Constitutional: He is oriented to person, place, and time. He appears well-nourished.  HENT:  Right Ear: Tympanic membrane and ear canal normal.  Left Ear: Tympanic membrane and ear canal normal.  Mouth/Throat: Oropharynx is clear and moist.  Eyes: Pupils are equal, round, and reactive to light. EOM are normal.  Cardiovascular: Normal rate and regular rhythm.  Respiratory: Effort normal and breath sounds normal.  GI: Soft. Bowel sounds are normal. There is no abdominal tenderness.  Musculoskeletal:        General: Normal range of motion.     Cervical back: Neck supple.  Neurological: He is alert and oriented to person, place, and time. No cranial nerve deficit.  Reflex Scores:      Patellar reflexes are 2+ on the right side and 2+ on the left side. Skin: Skin is warm and  dry.  Psychiatric: He has a normal mood and affect.           Assessment & Plan:

## 2019-04-26 NOTE — Assessment & Plan Note (Signed)
Tetanus due, he declines. Influenza vaccination UTD. Colonoscopy due, he declines and will think about this. Discussed the importance of a healthy diet and regular exercise in order for weight loss, and to reduce the risk of any potential medical problems.  Exam today unremarkable. Labs pending.

## 2019-04-26 NOTE — Assessment & Plan Note (Signed)
Above goal in the office today, slightly improved on recheck. He will start monitoring his blood pressure at home and report readings that are consistently at or above 135/90. CMP pending.  Continue lisinopril 10 mg.

## 2019-04-26 NOTE — Assessment & Plan Note (Signed)
Improved as of recent, continue to monitor.

## 2019-04-26 NOTE — Assessment & Plan Note (Signed)
Doing better on claritin, continue same. Continue Flonase.

## 2019-04-26 NOTE — Assessment & Plan Note (Signed)
Improved as of recent. Continue to monitor.

## 2019-04-26 NOTE — Addendum Note (Signed)
Addended by: Doreene Nest on: 04/26/2019 08:54 AM   Modules accepted: Orders

## 2019-04-26 NOTE — Assessment & Plan Note (Signed)
Encouraged a healthy diet, regular exercise, weight loss. Repeat lipid panel pending.

## 2019-05-02 DIAGNOSIS — H5213 Myopia, bilateral: Secondary | ICD-10-CM | POA: Diagnosis not present

## 2019-05-10 ENCOUNTER — Other Ambulatory Visit: Payer: BC Managed Care – PPO

## 2019-05-10 ENCOUNTER — Other Ambulatory Visit: Payer: Self-pay | Admitting: Primary Care

## 2019-05-10 DIAGNOSIS — I10 Essential (primary) hypertension: Secondary | ICD-10-CM

## 2019-05-17 ENCOUNTER — Other Ambulatory Visit (INDEPENDENT_AMBULATORY_CARE_PROVIDER_SITE_OTHER): Payer: BC Managed Care – PPO

## 2019-05-17 ENCOUNTER — Other Ambulatory Visit: Payer: Self-pay

## 2019-05-17 ENCOUNTER — Encounter: Payer: Self-pay | Admitting: Primary Care

## 2019-05-17 DIAGNOSIS — E785 Hyperlipidemia, unspecified: Secondary | ICD-10-CM | POA: Diagnosis not present

## 2019-05-17 DIAGNOSIS — I10 Essential (primary) hypertension: Secondary | ICD-10-CM

## 2019-05-17 LAB — COMPREHENSIVE METABOLIC PANEL
ALT: 34 U/L (ref 0–53)
AST: 26 U/L (ref 0–37)
Albumin: 4.7 g/dL (ref 3.5–5.2)
Alkaline Phosphatase: 43 U/L (ref 39–117)
BUN: 16 mg/dL (ref 6–23)
CO2: 28 mEq/L (ref 19–32)
Calcium: 9.7 mg/dL (ref 8.4–10.5)
Chloride: 102 mEq/L (ref 96–112)
Creatinine, Ser: 0.76 mg/dL (ref 0.40–1.50)
GFR: 110.03 mL/min (ref >60.00)
Glucose, Bld: 106 mg/dL — ABNORMAL HIGH (ref 70–99)
Potassium: 4.3 mEq/L (ref 3.5–5.1)
Sodium: 137 mEq/L (ref 135–145)
Total Bilirubin: 0.6 mg/dL (ref 0.2–1.2)
Total Protein: 7.1 g/dL (ref 6.0–8.3)

## 2019-05-17 LAB — LIPID PANEL
Cholesterol: 233 mg/dL — ABNORMAL HIGH (ref 0–200)
HDL: 46.9 mg/dL (ref >39.00)
LDL Cholesterol: 158 mg/dL — ABNORMAL HIGH (ref 0–99)
NonHDL: 186.22
Total CHOL/HDL Ratio: 5
Triglycerides: 142 mg/dL (ref 0.0–149.0)
VLDL: 28.4 mg/dL (ref 0.0–40.0)

## 2019-05-17 LAB — CBC
HCT: 44.2 % (ref 39.0–52.0)
Hemoglobin: 15 g/dL (ref 13.0–17.0)
MCHC: 34 g/dL (ref 30.0–36.0)
MCV: 88.2 fl (ref 78.0–100.0)
Platelets: 195 10*3/uL (ref 150.0–400.0)
RBC: 5.01 Mil/uL (ref 4.22–5.81)
RDW: 13.9 % (ref 11.5–15.5)
WBC: 7.1 10*3/uL (ref 4.0–10.5)

## 2019-05-17 LAB — HEMOGLOBIN A1C: Hgb A1c MFr Bld: 5.4 % (ref 4.6–6.5)

## 2019-05-18 ENCOUNTER — Ambulatory Visit: Payer: BC Managed Care – PPO | Attending: Internal Medicine

## 2019-05-18 DIAGNOSIS — Z23 Encounter for immunization: Secondary | ICD-10-CM

## 2019-05-18 NOTE — Progress Notes (Signed)
   Covid-19 Vaccination Clinic  Name:  Kevin Bailey    MRN: [id number] DOB: July 29, 1970  05/18/2019  Mr. Heffern was observed post Covid-19 immunization for 15 minutes without incident. He was provided with Vaccine Information Sheet and instruction to access the V-Safe system.   Mr. Narine was instructed to call 911 with any severe reactions post vaccine: Marland Kitchen Difficulty breathing  . Swelling of face and throat  . A fast heartbeat  . A bad rash all over body  . Dizziness and weakness   Immunizations Administered    Name Date Dose VIS Date Route   Pfizer COVID-19 Vaccine 05/18/2019  8:55 AM 0.3 mL 02/01/2019 Intramuscular   Manufacturer: [employer], [employer]   Lot: [id number]   NDC: [address]-9

## 2019-06-11 ENCOUNTER — Ambulatory Visit: Payer: BC Managed Care – PPO | Attending: Internal Medicine

## 2019-06-11 DIAGNOSIS — Z23 Encounter for immunization: Secondary | ICD-10-CM

## 2019-06-11 NOTE — Progress Notes (Signed)
   Covid-19 Vaccination Clinic  Name:  Shawnmichael Parenteau    MRN: [id number] DOB: 10-29-1970  06/11/2019  Mr. Elza was observed post Covid-19 immunization for 15 minutes without incident. He was provided with Vaccine Information Sheet and instruction to access the V-Safe system.   Mr. Ursua was instructed to call 911 with any severe reactions post vaccine: Marland Kitchen Difficulty breathing  . Swelling of face and throat  . A fast heartbeat  . A bad rash all over body  . Dizziness and weakness   Immunizations Administered    Name Date Dose VIS Date Route   Pfizer COVID-19 Vaccine 06/11/2019  9:06 AM 0.3 mL 04/17/2018 Intramuscular   Manufacturer: [employer], [employer]   Lot: [id number]   NDC: [address]-5

## 2019-08-04 ENCOUNTER — Other Ambulatory Visit: Payer: Self-pay | Admitting: Primary Care

## 2019-08-04 DIAGNOSIS — I10 Essential (primary) hypertension: Secondary | ICD-10-CM

## 2020-02-05 ENCOUNTER — Other Ambulatory Visit: Payer: Self-pay | Admitting: Primary Care

## 2020-02-05 DIAGNOSIS — I10 Essential (primary) hypertension: Secondary | ICD-10-CM

## 2020-04-04 ENCOUNTER — Other Ambulatory Visit: Payer: Self-pay | Admitting: Primary Care

## 2020-04-04 DIAGNOSIS — I10 Essential (primary) hypertension: Secondary | ICD-10-CM

## 2020-04-30 ENCOUNTER — Encounter: Payer: Self-pay | Admitting: Primary Care

## 2020-04-30 ENCOUNTER — Ambulatory Visit (INDEPENDENT_AMBULATORY_CARE_PROVIDER_SITE_OTHER): Payer: BC Managed Care – PPO | Admitting: Primary Care

## 2020-04-30 ENCOUNTER — Other Ambulatory Visit: Payer: Self-pay

## 2020-04-30 VITALS — BP 126/76 | HR 88 | Temp 97.6°F | Ht 66.0 in | Wt 246.0 lb

## 2020-04-30 DIAGNOSIS — M25511 Pain in right shoulder: Secondary | ICD-10-CM

## 2020-04-30 DIAGNOSIS — Z0000 Encounter for general adult medical examination without abnormal findings: Secondary | ICD-10-CM | POA: Diagnosis not present

## 2020-04-30 DIAGNOSIS — G8929 Other chronic pain: Secondary | ICD-10-CM

## 2020-04-30 DIAGNOSIS — M549 Dorsalgia, unspecified: Secondary | ICD-10-CM

## 2020-04-30 DIAGNOSIS — M542 Cervicalgia: Secondary | ICD-10-CM | POA: Diagnosis not present

## 2020-04-30 DIAGNOSIS — J309 Allergic rhinitis, unspecified: Secondary | ICD-10-CM | POA: Diagnosis not present

## 2020-04-30 DIAGNOSIS — Z1211 Encounter for screening for malignant neoplasm of colon: Secondary | ICD-10-CM

## 2020-04-30 DIAGNOSIS — E785 Hyperlipidemia, unspecified: Secondary | ICD-10-CM

## 2020-04-30 DIAGNOSIS — I10 Essential (primary) hypertension: Secondary | ICD-10-CM | POA: Diagnosis not present

## 2020-04-30 MED ORDER — LISINOPRIL 10 MG PO TABS: ORAL_TABLET | ORAL | 3 refills | Status: DC

## 2020-04-30 NOTE — Assessment & Plan Note (Signed)
Chronic, no worse. Completed PT in the past.  No concerns today. 

## 2020-04-30 NOTE — Assessment & Plan Note (Signed)
Discussed the importance of a healthy diet and regular exercise in order for weight loss, and to reduce the risk of any potential medical problems.  Repeat lipid panel.  

## 2020-04-30 NOTE — Progress Notes (Signed)
Subjective:    Patient ID: Kevin Bailey, male    DOB: 1970/09/01, 50 y.o.   MRN: [id number]  HPI  Kevin Bailey is a very pleasant 50 y.o. male who presents today for complete physical.  Immunizations: -Tetanus: Declines -Influenza: Completed this season  -Covid-19: Completed three vaccines  Diet: he endorses a fair diet.  Exercise: He is not exercising.   Eye exam: Completes annually  Dental exam: Completes semi-annually   Colonoscopy: Never completed  BP Readings from Last 3 Encounters:  04/30/20 126/76  04/26/19 136/80  05/11/18 130/78      Review of Systems  Constitutional: Negative for unexpected weight change.  HENT: Negative for rhinorrhea.   Eyes: Negative for visual disturbance.  Respiratory: Negative for cough and shortness of breath.   Cardiovascular: Negative for chest pain.  Gastrointestinal: Negative for constipation and diarrhea.  Genitourinary: Negative for difficulty urinating.  Musculoskeletal: Positive for arthralgias.       Chronic shoulder pain, stable  Skin: Negative for rash.  Allergic/Immunologic: Positive for environmental allergies.  Neurological: Negative for dizziness, numbness and headaches.  Psychiatric/Behavioral: The patient is not nervous/anxious.          Past Medical History:  Diagnosis Date  . Acid reflux   . Costochondritis 01/12/2015  . Heart murmur   . Hyperlipidemia   . Hypertension   . Sinus infection     Social History   Socioeconomic History  . Marital status: Married    Spouse name: [name]  . Number of children: Not on file  . Years of education: 4  . Highest education level: Not on file  Occupational History  . Occupation: Financial planner  Tobacco Use  . Smoking status: Never Smoker  . Smokeless tobacco: Never Used  Substance and Sexual Activity  . Alcohol use: Yes    Alcohol/week: 2.0 standard drinks    Types: 2 Standard drinks or equivalent per week  . Drug use: No  .  Sexual activity: Yes    Partners: Female  Other Topics Concern  . Not on file  Social History Narrative   Lives with wife, [name]   No children.   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    Social Determinants of Health   Financial Resource Strain: Not on file  Food Insecurity: Not on file  Transportation Needs: Not on file  Physical Activity: Not on file  Stress: Not on file  Social Connections: Not on file  Intimate Partner Violence: Not on file    Past Surgical History:  Procedure Laterality Date  . WISDOM TOOTH EXTRACTION      Family History  Problem Relation Age of Onset  . Hypertension Father   . Anemia Father   . Diabetes Other        grandmother   . Diabetes Paternal Grandmother   . Heart disease Paternal Grandfather   . Neuropathy Neg Hx   . Neurofibromatosis Neg Hx   . Multiple sclerosis Neg Hx     Allergies  Allergen Reactions  . Hydrochlorothiazide Other (See Comments)    Dizziness, felt weak, fatigued  . Prednisone Other (See Comments)    Hypotension  . Flonase [Fluticasone Propionate]     irritabile  . Other     "Any medicines with steroids in them"    Current Outpatient Medications on File Prior to Visit  Medication Sig Dispense Refill  . aspirin 81 MG  tablet Take 81 mg by mouth daily.    . fluticasone (FLONASE) 50 MCG/ACT nasal spray Place 2 sprays into both nostrils daily.    Marland Kitchen ibuprofen (ADVIL,MOTRIN) 200 MG tablet Take 200 mg by mouth every 6 (six) hours as needed.    Marland Kitchen lisinopril (ZESTRIL) 10 MG tablet TAKE 1 TABLET BY MOUTH EVERY DAY FOR BLOOD PRESSURE 30 tablet 0  . loratadine (CLARITIN) 10 MG tablet Take 10 mg by mouth daily.     No current facility-administered medications on file prior to visit.    BP 126/76   Pulse 88   Temp 97.6 F (36.4 C) (Temporal)   Ht 5\' 6"  (1.676 m)   Wt 246 lb (111.6 kg)   SpO2 96%   BMI 39.71 kg/m  Objective:   Physical Exam HENT:      Right Ear: Tympanic membrane and ear canal normal.     Left Ear: Tympanic membrane and ear canal normal.     Nose: Nose normal.     Right Sinus: No maxillary sinus tenderness or frontal sinus tenderness.     Left Sinus: No maxillary sinus tenderness or frontal sinus tenderness.  Eyes:     Conjunctiva/sclera: Conjunctivae normal.     Pupils: Pupils are equal, round, and reactive to light.  Neck:     Thyroid: No thyromegaly.     Vascular: No carotid bruit.  Cardiovascular:     Rate and Rhythm: Normal rate and regular rhythm.     Heart sounds: Normal heart sounds.  Pulmonary:     Effort: Pulmonary effort is normal.     Breath sounds: Normal breath sounds. No wheezing or rales.  Abdominal:     General: Bowel sounds are normal.     Palpations: Abdomen is soft.     Tenderness: There is no abdominal tenderness.  Musculoskeletal:        General: Normal range of motion.     Cervical back: Neck supple.  Skin:    General: Skin is warm and dry.  Neurological:     Mental Status: He is alert and oriented to person, place, and time.     Cranial Nerves: No cranial nerve deficit.     Deep Tendon Reflexes: Reflexes are normal and symmetric.  Psychiatric:        Mood and Affect: Mood normal.           Assessment & Plan:      This visit occurred during the SARS-CoV-2 public health emergency.  Safety protocols were in place, including screening questions prior to the visit, additional usage of staff PPE, and extensive cleaning of exam room while observing appropriate contact time as indicated for disinfecting solutions.

## 2020-04-30 NOTE — Patient Instructions (Signed)
Start exercising. You should be getting 150 minutes of moderate intensity exercise weekly.  It's important to improve your diet by reducing consumption of fast food, fried food, processed snack foods, sugary drinks. Increase consumption of fresh vegetables and fruits, whole grains, water.  Ensure you are drinking 64 ounces of water daily.  You will be contacted regarding your referral to GI for the colonoscopy.  Please let us know if you have not been contacted within two weeks.   It was a pleasure to see you today!   Preventive Care 48-6 Years Old, Male Preventive care refers to lifestyle choices and visits with your health care provider that can promote health and wellness. This includes:  A yearly physical exam. This is also called an annual wellness visit.  Regular dental and eye exams.  Immunizations.  Screening for certain conditions.  Healthy lifestyle choices, such as: ? Eating a healthy diet. ? Getting regular exercise. ? Not using drugs or products that contain nicotine and tobacco. ? Limiting alcohol use. What can I expect for my preventive care visit? Physical exam Your health care provider will check your:  Height and weight. These may be used to calculate your BMI (body mass index). BMI is a measurement that tells if you are at a healthy weight.  Heart rate and blood pressure.  Body temperature.  Skin for abnormal spots. Counseling Your health care provider may ask you questions about your:  Past medical problems.  Family's medical history.  Alcohol, tobacco, and drug use.  Emotional well-being.  Home life and relationship well-being.  Sexual activity.  Diet, exercise, and sleep habits.  Work and work Astronomer.  Access to firearms. What immunizations do I need? Vaccines are usually given at various ages, according to a schedule. Your health care provider will recommend vaccines for you based on your age, medical history, and lifestyle or  other factors, such as travel or where you work.   What tests do I need? Blood tests  Lipid and cholesterol levels. These may be checked every 5 years, or more often if you are over 48 years old.  Hepatitis C test.  Hepatitis B test. Screening  Lung cancer screening. You may have this screening every year starting at age 48 if you have a 30-pack-year history of smoking and currently smoke or have quit within the past 15 years.  Prostate cancer screening. Recommendations will vary depending on your family history and other risks.  Genital exam to check for testicular cancer or hernias.  Colorectal cancer screening. ? All adults should have this screening starting at age 48 and continuing until age 29. ? Your health care provider may recommend screening at age 52 if you are at increased risk. ? You will have tests every 1-10 years, depending on your results and the type of screening test.  Diabetes screening. ? This is done by checking your blood sugar (glucose) after you have not eaten for a while (fasting). ? You may have this done every 1-3 years.  STD (sexually transmitted disease) testing, if you are at risk. Follow these instructions at home: Eating and drinking  Eat a diet that includes fresh fruits and vegetables, whole grains, lean protein, and low-fat dairy products.  Take vitamin and mineral supplements as recommended by your health care provider.  Do not drink alcohol if your health care provider tells you not to drink.  If you drink alcohol: ? Limit how much you have to 0-2 drinks a day. ? Be aware  of how much alcohol is in your drink. In the [place] one drink equals one 12 oz bottle of beer (355 mL), one 5 oz glass of wine (148 mL), or one 1 oz glass of hard liquor (44 mL).   Lifestyle  Take daily care of your teeth and gums. Brush your teeth every morning and night with fluoride toothpaste. Floss one time each day.  Stay active. Exercise for at least 30 minutes  5 or more days each week.  Do not use any products that contain nicotine or tobacco, such as cigarettes, e-cigarettes, and chewing tobacco. If you need help quitting, ask your health care provider.  Do not use drugs.  If you are sexually active, practice safe sex. Use a condom or other form of protection to prevent STIs (sexually transmitted infections).  If told by your health care provider, take low-dose aspirin daily starting at age 87.  Find healthy ways to cope with stress, such as: ? Meditation, yoga, or listening to music. ? Journaling. ? Talking to a trusted person. ? Spending time with friends and family. Safety  Always wear your seat belt while driving or riding in a vehicle.  Do not drive: ? If you have been drinking alcohol. Do not ride with someone who has been drinking. ? When you are tired or distracted. ? While texting.  Wear a helmet and other protective equipment during sports activities.  If you have firearms in your house, make sure you follow all gun safety procedures. What's next?  Go to your health care provider once a year for an annual wellness visit.  Ask your health care provider how often you should have your eyes and teeth checked.  Stay up to date on all vaccines. This information is not intended to replace advice given to you by your health care provider. Make sure you discuss any questions you have with your health care provider. Document Revised: 11/06/2018 Document Reviewed: 02/01/2018 Elsevier Patient Education  2021 [employer].

## 2020-04-30 NOTE — Assessment & Plan Note (Signed)
Declines tetanus today. Other vaccines UTD. Colonoscopy due, referral placed to GI.  Discussed the importance of a healthy diet and regular exercise in order for weight loss, and to reduce the risk of any potential medical problems.  Exam today stable. Labs pending.

## 2020-04-30 NOTE — Assessment & Plan Note (Signed)
Chronic, no worse. Completed PT in the past.  No concerns today.

## 2020-04-30 NOTE — Assessment & Plan Note (Signed)
Stable, doing well on Flonase and Claritin.  Continue same.

## 2020-04-30 NOTE — Assessment & Plan Note (Signed)
Well controlled in the office today, continue lisinopril 10 mg. CMP pending. 

## 2020-05-15 ENCOUNTER — Other Ambulatory Visit: Payer: Self-pay

## 2020-05-15 ENCOUNTER — Other Ambulatory Visit (INDEPENDENT_AMBULATORY_CARE_PROVIDER_SITE_OTHER): Payer: BC Managed Care – PPO

## 2020-05-15 DIAGNOSIS — I10 Essential (primary) hypertension: Secondary | ICD-10-CM | POA: Diagnosis not present

## 2020-05-15 LAB — LIPID PANEL
Cholesterol: 240 mg/dL — ABNORMAL HIGH (ref 0–200)
HDL: 51.7 mg/dL (ref >39.00)
LDL Cholesterol: 171 mg/dL — ABNORMAL HIGH (ref 0–99)
NonHDL: 187.84
Total CHOL/HDL Ratio: 5
Triglycerides: 82 mg/dL (ref 0.0–149.0)
VLDL: 16.4 mg/dL (ref 0.0–40.0)

## 2020-05-15 LAB — COMPREHENSIVE METABOLIC PANEL
ALT: 27 U/L (ref 0–53)
AST: 22 U/L (ref 0–37)
Albumin: 4.7 g/dL (ref 3.5–5.2)
Alkaline Phosphatase: 33 U/L — ABNORMAL LOW (ref 39–117)
BUN: 16 mg/dL (ref 6–23)
CO2: 27 mEq/L (ref 19–32)
Calcium: 9.5 mg/dL (ref 8.4–10.5)
Chloride: 103 mEq/L (ref 96–112)
Creatinine, Ser: 0.76 mg/dL (ref 0.40–1.50)
GFR: 106.81 mL/min (ref >60.00)
Glucose, Bld: 108 mg/dL — ABNORMAL HIGH (ref 70–99)
Potassium: 4.3 mEq/L (ref 3.5–5.1)
Sodium: 139 mEq/L (ref 135–145)
Total Bilirubin: 0.5 mg/dL (ref 0.2–1.2)
Total Protein: 7.3 g/dL (ref 6.0–8.3)

## 2020-05-16 DIAGNOSIS — E785 Hyperlipidemia, unspecified: Secondary | ICD-10-CM

## 2021-02-25 DIAGNOSIS — H524 Presbyopia: Secondary | ICD-10-CM | POA: Diagnosis not present

## 2021-03-15 DIAGNOSIS — K5730 Diverticulosis of large intestine without perforation or abscess without bleeding: Secondary | ICD-10-CM | POA: Diagnosis not present

## 2021-03-15 DIAGNOSIS — D124 Benign neoplasm of descending colon: Secondary | ICD-10-CM | POA: Diagnosis not present

## 2021-03-15 DIAGNOSIS — D122 Benign neoplasm of ascending colon: Secondary | ICD-10-CM | POA: Diagnosis not present

## 2021-03-15 DIAGNOSIS — K635 Polyp of colon: Secondary | ICD-10-CM | POA: Diagnosis not present

## 2021-03-15 DIAGNOSIS — Z8371 Family history of colonic polyps: Secondary | ICD-10-CM | POA: Diagnosis not present

## 2021-03-15 DIAGNOSIS — Z791 Long term (current) use of non-steroidal anti-inflammatories (NSAID): Secondary | ICD-10-CM | POA: Diagnosis not present

## 2021-03-15 DIAGNOSIS — I10 Essential (primary) hypertension: Secondary | ICD-10-CM | POA: Diagnosis not present

## 2021-03-15 DIAGNOSIS — Z1211 Encounter for screening for malignant neoplasm of colon: Secondary | ICD-10-CM | POA: Diagnosis not present

## 2021-03-15 DIAGNOSIS — E785 Hyperlipidemia, unspecified: Secondary | ICD-10-CM | POA: Diagnosis not present

## 2021-03-15 LAB — HM COLONOSCOPY

## 2021-03-19 LAB — HM COLONOSCOPY

## 2021-03-21 ENCOUNTER — Other Ambulatory Visit: Payer: Self-pay

## 2021-03-21 ENCOUNTER — Emergency Department
Admission: EM | Admit: 2021-03-21 | Discharge: 2021-03-21 | Disposition: A | Discharge status: Home / Self Care | Payer: BC Managed Care – PPO | Source: Home / Self Care

## 2021-03-21 DIAGNOSIS — L0501 Pilonidal cyst with abscess: Secondary | ICD-10-CM | POA: Diagnosis not present

## 2021-03-21 MED ORDER — SULFAMETHOXAZOLE-TRIMETHOPRIM 800-160 MG PO TABS: 1.0000 | ORAL_TABLET | Freq: Two times a day (BID) | ORAL | 0 refills | Status: AC

## 2021-03-21 NOTE — ED Provider Notes (Signed)
[facility]    CSN: [id number] Arrival date & time: 03/21/21  0800      History   Chief Complaint Chief Complaint  Patient presents with   Abscess    Abscess on buttock. X6 days    HPI Kevin Bailey is a 51 y.o. male.   HPI 51 year old male presents with cutaneous abscess/sebaceous cyst of buttocks for 6 days.  PMH significant for HTN and chronic neck and back pain.  Past Medical History:  Diagnosis Date   Acid reflux    Costochondritis 01/12/2015   Heart murmur    Hyperlipidemia    Hypertension    Sinus infection     Patient Active Problem List   Diagnosis Date Noted   Allergic rhinitis 04/20/2018   Chronic shoulder pain 04/20/2018   Preventative health care 03/31/2017   Chronic neck and back pain 09/23/2016   Hyperlipidemia 05/08/2014   Essential hypertension 04/04/2014    Past Surgical History:  Procedure Laterality Date   WISDOM TOOTH EXTRACTION         Home Medications    Prior to Admission medications   Medication Sig Start Date End Date Taking? Authorizing Provider  aspirin 81 MG tablet Take 81 mg by mouth daily.   Yes [provider]  fluticasone (FLONASE) 50 MCG/ACT nasal spray Place 2 sprays into both nostrils daily.   Yes [provider]  ibuprofen (ADVIL,MOTRIN) 200 MG tablet Take 200 mg by mouth every 6 (six) hours as needed.   Yes [provider]  lisinopril (ZESTRIL) 10 MG tablet TAKE 1 TABLET BY MOUTH EVERY DAY FOR BLOOD PRESSURE 04/30/20  Yes Doreene Nest, NP  loratadine (CLARITIN) 10 MG tablet Take 10 mg by mouth daily.   Yes [provider]  sulfamethoxazole-trimethoprim (BACTRIM DS) 800-160 MG tablet Take 1 tablet by mouth 2 (two) times daily for 10 days. 03/21/21 03/31/21 Yes Trevor Iha, FNP    Family History Family History  Problem Relation Age of Onset   Hypertension Father    Anemia Father    Diabetes Other        grandmother    Diabetes Paternal Grandmother     Heart disease Paternal Grandfather    Neuropathy Neg Hx    Neurofibromatosis Neg Hx    Multiple sclerosis Neg Hx     Social History Social History   Tobacco Use   Smoking status: Never   Smokeless tobacco: Never  Substance Use Topics   Alcohol use: Yes    Alcohol/week: 2.0 standard drinks    Types: 2 Standard drinks or equivalent per week   Drug use: No     Allergies   Hydrochlorothiazide, Prednisone, Flonase [fluticasone propionate], and Other   Review of Systems Review of Systems  Skin:        Cutaneous abscess/sebaceous cyst of buttocks x 6 days    Physical Exam Triage Vital Signs ED Triage Vitals  Enc Vitals Group     BP 03/21/21 0816 131/84     Pulse Rate 03/21/21 0816 (!) 101     Resp 03/21/21 0816 18     Temp 03/21/21 0816 99.6 F (37.6 C)     Temp Source 03/21/21 0816 Oral     SpO2 03/21/21 0816 96 %     Weight 03/21/21 0814 237 lb (107.5 kg)     Height 03/21/21 0814 5\' 7"  (1.702 m)     Head Circumference --      Peak Flow --  Pain Score 03/21/21 0814 8     Pain Loc --      Pain Edu? --      Excl. in GC? --    No data found.  Updated Vital Signs BP 131/84 (BP Location: Left Arm)    Pulse (!) 101    Temp 99.6 F (37.6 C) (Oral)    Resp 18    Ht 5\' 7"  (1.702 m)    Wt 237 lb (107.5 kg)    SpO2 96%    BMI 37.12 kg/m      Physical Exam Vitals and nursing note reviewed.  Constitutional:      General: He is not in acute distress.    Appearance: Normal appearance. He is obese.  HENT:     Mouth/Throat:     Mouth: Mucous membranes are moist.     Pharynx: Oropharynx is clear.  Eyes:     Extraocular Movements: Extraocular movements intact.     Conjunctiva/sclera: Conjunctivae normal.     Pupils: Pupils are equal, round, and reactive to light.  Cardiovascular:     Pulses: Normal pulses.     Heart sounds: Normal heart sounds.  Pulmonary:     Effort: Pulmonary effort is normal.     Breath sounds: Normal breath sounds.  Musculoskeletal:      Cervical back: Normal range of motion and neck supple.  Skin:    General: Skin is warm and dry.     Comments: Buttocks (inferior gluteal cleft): 7 cm x 6.5 cm rectangular shaped erythematous indurated cutaneous cyst, no discharge/drainage/lymphatic streaking.  Neurological:     General: No focal deficit present.     Mental Status: He is alert and oriented to person, place, and time.     UC Treatments / Results  Labs (all labs ordered are listed, but only abnormal results are displayed) Labs Reviewed - No data to display  EKG   Radiology No results found.  Procedures Procedures (including critical care time)  Medications Ordered in UC Medications - No data to display  Initial Impression / Assessment and Plan / UC Course  I have reviewed the triage vital signs and the nursing notes.  Pertinent labs & imaging results that were available during my care of the patient were reviewed by me and considered in my medical decision making (see chart for details).     MDM: 1.  Pilonidal abscess-Rx'd Bactrim. Advised patient to take medication as directed with food to completion.  Encouraged patient to increase daily water intake while taking this medication. Final Clinical Impressions(s) / UC Diagnoses   Final diagnoses:  Pilonidal abscess     Discharge Instructions      Advised patient to take medication as directed with food to completion.  Encouraged patient to increase daily water intake while taking this medication.     ED Prescriptions     Medication Sig Dispense Auth. Provider   sulfamethoxazole-trimethoprim (BACTRIM DS) 800-160 MG tablet Take 1 tablet by mouth 2 (two) times daily for 10 days. 20 tablet , FNP      PDMP not reviewed this encounter.   Trevor Iha, FNP 03/21/21 [phone]

## 2021-03-21 NOTE — Discharge Instructions (Addendum)
Advised patient to take medication as directed with food to completion.  Encouraged patient to increase daily water intake while taking this medication. 

## 2021-03-21 NOTE — ED Triage Notes (Signed)
Pt states that he has an abscess on his buttock. X6 days  Pt states that it hasn't drained any.

## 2021-03-22 ENCOUNTER — Encounter: Payer: Self-pay | Admitting: Primary Care

## 2021-05-01 ENCOUNTER — Other Ambulatory Visit: Payer: Self-pay | Admitting: Primary Care

## 2021-05-01 DIAGNOSIS — I10 Essential (primary) hypertension: Secondary | ICD-10-CM

## 2021-05-26 ENCOUNTER — Other Ambulatory Visit: Payer: Self-pay | Admitting: Primary Care

## 2021-05-26 DIAGNOSIS — I10 Essential (primary) hypertension: Secondary | ICD-10-CM

## 2021-06-02 ENCOUNTER — Ambulatory Visit (INDEPENDENT_AMBULATORY_CARE_PROVIDER_SITE_OTHER): Payer: BC Managed Care – PPO | Admitting: Primary Care

## 2021-06-02 ENCOUNTER — Encounter: Payer: Self-pay | Admitting: Primary Care

## 2021-06-02 VITALS — BP 130/82 | HR 108 | Temp 98.0°F | Ht 66.75 in | Wt 245.0 lb

## 2021-06-02 DIAGNOSIS — Z0000 Encounter for general adult medical examination without abnormal findings: Secondary | ICD-10-CM | POA: Diagnosis not present

## 2021-06-02 DIAGNOSIS — M542 Cervicalgia: Secondary | ICD-10-CM

## 2021-06-02 DIAGNOSIS — I10 Essential (primary) hypertension: Secondary | ICD-10-CM | POA: Diagnosis not present

## 2021-06-02 DIAGNOSIS — J309 Allergic rhinitis, unspecified: Secondary | ICD-10-CM | POA: Diagnosis not present

## 2021-06-02 DIAGNOSIS — E785 Hyperlipidemia, unspecified: Secondary | ICD-10-CM

## 2021-06-02 DIAGNOSIS — M549 Dorsalgia, unspecified: Secondary | ICD-10-CM

## 2021-06-02 DIAGNOSIS — G8929 Other chronic pain: Secondary | ICD-10-CM

## 2021-06-02 MED ORDER — LISINOPRIL 10 MG PO TABS: ORAL_TABLET | ORAL | 3 refills | Status: DC

## 2021-06-02 NOTE — Patient Instructions (Signed)
Stop by the lab prior to leaving today. I will notify you of your results once received.  ? ?Schedule a lab appointment to return for your labs. ? ?It was a pleasure to see you today! ? ?Preventive Care 63-49 Years Old, Male ?Preventive care refers to lifestyle choices and visits with your health care provider that can promote health and wellness. Preventive care visits are also called wellness exams. ?What can I expect for my preventive care visit? ?Counseling ?During your preventive care visit, your health care provider may ask about your: ?Medical history, including: ?Past medical problems. ?Family medical history. ?Current health, including: ?Emotional well-being. ?Home life and relationship well-being. ?Sexual activity. ?Lifestyle, including: ?Alcohol, nicotine or tobacco, and drug use. ?Access to firearms. ?Diet, exercise, and sleep habits. ?Safety issues such as seatbelt and bike helmet use. ?Sunscreen use. ?Work and work Astronomer. ?Physical exam ?Your health care provider will check your: ?Height and weight. These may be used to calculate your BMI (body mass index). BMI is a measurement that tells if you are at a healthy weight. ?Waist circumference. This measures the distance around your waistline. This measurement also tells if you are at a healthy weight and may help predict your risk of certain diseases, such as type 2 diabetes and high blood pressure. ?Heart rate and blood pressure. ?Body temperature. ?Skin for abnormal spots. ?What immunizations do I need? ?Vaccines are usually given at various ages, according to a schedule. Your health care provider will recommend vaccines for you based on your age, medical history, and lifestyle or other factors, such as travel or where you work. ?What tests do I need? ?Screening ?Your health care provider may recommend screening tests for certain conditions. This may include: ?Lipid and cholesterol levels. ?Diabetes screening. This is done by checking your blood  sugar (glucose) after you have not eaten for a while (fasting). ?Hepatitis B test. ?Hepatitis C test. ?HIV (human immunodeficiency virus) test. ?STI (sexually transmitted infection) testing, if you are at risk. ?Lung cancer screening. ?Prostate cancer screening. ?Colorectal cancer screening. ?Talk with your health care provider about your test results, treatment options, and if necessary, the need for more tests. ?Follow these instructions at home: ?Eating and drinking ? ?Eat a diet that includes fresh fruits and vegetables, whole grains, lean protein, and low-fat dairy products. ?Take vitamin and mineral supplements as recommended by your health care provider. ?Do not drink alcohol if your health care provider tells you not to drink. ?If you drink alcohol: ?Limit how much you have to 0-2 drinks a day. ?Know how much alcohol is in your drink. In the [place] one drink equals one 12 oz bottle of beer (355 mL), one 5 oz glass of wine (148 mL), or one 1? oz glass of hard liquor (44 mL). ?Lifestyle ?Brush your teeth every morning and night with fluoride toothpaste. Floss one time each day. ?Exercise for at least 30 minutes 5 or more days each week. ?Do not use any products that contain nicotine or tobacco. These products include cigarettes, chewing tobacco, and vaping devices, such as e-cigarettes. If you need help quitting, ask your health care provider. ?Do not use drugs. ?If you are sexually active, practice safe sex. Use a condom or other form of protection to prevent STIs. ?Take aspirin only as told by your health care provider. Make sure that you understand how much to take and what form to take. Work with your health care provider to find out whether it is safe and beneficial for you  to take aspirin daily. ?Find healthy ways to manage stress, such as: ?Meditation, yoga, or listening to music. ?Journaling. ?Talking to a trusted person. ?Spending time with friends and family. ?Minimize exposure to UV radiation to  reduce your risk of skin cancer. ?Safety ?Always wear your seat belt while driving or riding in a vehicle. ?Do not drive: ?If you have been drinking alcohol. Do not ride with someone who has been drinking. ?When you are tired or distracted. ?While texting. ?If you have been using any mind-altering substances or drugs. ?Wear a helmet and other protective equipment during sports activities. ?If you have firearms in your house, make sure you follow all gun safety procedures. ?What's next? ?Go to your health care provider once a year for an annual wellness visit. ?Ask your health care provider how often you should have your eyes and teeth checked. ?Stay up to date on all vaccines. ?This information is not intended to replace advice given to you by your health care provider. Make sure you discuss any questions you have with your health care provider. ?Document Revised: 08/05/2020 Document Reviewed: 08/05/2020 ?Elsevier Patient Education ? 2022 [employer] ? ?

## 2021-06-02 NOTE — Assessment & Plan Note (Signed)
Controlled.  ? ?Continue Claritin 10 mg daily as needed, Flonase PRN. ?

## 2021-06-02 NOTE — Assessment & Plan Note (Signed)
Declines tetanus vaccine.  ?Colonoscopy UTD, due 2028. ? ?Discussed the importance of a healthy diet and regular exercise in order for weight loss, and to reduce the risk of further co-morbidity. ? ?Exam today stable. ?Labs pending. He will return later this week. ?

## 2021-06-02 NOTE — Assessment & Plan Note (Signed)
Chronic, stable. No concerns today.  Continue to monitor.  

## 2021-06-02 NOTE — Assessment & Plan Note (Signed)
Controlled.   Continue lisinopril 10 mg daily.  CMP pending.  

## 2021-06-02 NOTE — Progress Notes (Signed)
? ?Subjective:  ? ? Patient ID: Kevin Bailey, male    DOB: Aug 18, 1970, 51 y.o.   MRN: [id number] ? ?HPI ? ?[name] is a very pleasant 51 y.o. male who presents today for complete physical and follow up of chronic conditions. ? ?Immunizations: ?-Tetanus: Declines  ?-Influenza: Completed last season  ?-Covid-19: 4 vaccines ? ?Diet: Fair diet.  ?Exercise: Walks daily ? ?Eye exam: Completes annually  ?Dental exam: Completes semi-annually  ? ?Colonoscopy: Completed in 2023, due 2028 ? ? ?BP Readings from Last 3 Encounters:  ?06/02/21 130/82  ?03/21/21 131/84  ?04/30/20 126/76  ? ? ? ? ?Review of Systems  ?Constitutional:  Negative for unexpected weight change.  ?HENT:  Negative for rhinorrhea.   ?Respiratory:  Negative for cough and shortness of breath.   ?Cardiovascular:  Negative for chest pain.  ?Gastrointestinal:  Negative for constipation and diarrhea.  ?Genitourinary:  Negative for difficulty urinating.  ?Musculoskeletal:  Positive for arthralgias.  ?Skin:  Negative for rash.  ?Allergic/Immunologic: Negative for environmental allergies.  ?Neurological:  Negative for dizziness, numbness and headaches.  ?Psychiatric/Behavioral:  The patient is not nervous/anxious.   ? ?   ? ? ?Past Medical History:  ?Diagnosis Date  ? Acid reflux   ? Costochondritis 01/12/2015  ? Heart murmur   ? Hyperlipidemia   ? Hypertension   ? Sinus infection   ? ? ?Social History  ? ?Socioeconomic History  ? Marital status: Married  ?  Spouse name: [name]  ? Number of children: Not on file  ? Years of education: 95  ? Highest education level: Not on file  ?Occupational History  ? Occupation: Financial planner  ?Tobacco Use  ? Smoking status: Never  ? Smokeless tobacco: Never  ?Substance and Sexual Activity  ? Alcohol use: Yes  ?  Alcohol/week: 2.0 standard drinks  ?  Types: 2 Standard drinks or equivalent per week  ? Drug use: No  ? Sexual activity: Yes  ?  Partners: Female  ?Other Topics Concern  ? Not on file   ?Social History Narrative  ? Lives with wife, [name]  ? No children.  ? Caffeine use:  A little coffee/soda/tea  ? 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  ? Enjoys cars.   ? ?Social Determinants of Health  ? ?Financial Resource Strain: Not on file  ?Food Insecurity: Not on file  ?Transportation Needs: Not on file  ?Physical Activity: Not on file  ?Stress: Not on file  ?Social Connections: Not on file  ?Intimate Partner Violence: Not on file  ? ? ?Past Surgical History:  ?Procedure Laterality Date  ? WISDOM TOOTH EXTRACTION    ? ? ?Family History  ?Problem Relation Age of Onset  ? Hypertension Father   ? Anemia Father   ? Diabetes Other   ?     grandmother   ? Diabetes Paternal Grandmother   ? Heart disease Paternal Grandfather   ? Neuropathy Neg Hx   ? Neurofibromatosis Neg Hx   ? Multiple sclerosis Neg Hx   ? ? ?Allergies  ?Allergen Reactions  ? Hydrochlorothiazide Other (See Comments)  ?  Dizziness, felt weak, fatigued  ? Prednisone Other (See Comments)  ?  Hypotension  ? Flonase [Fluticasone Propionate]   ?  irritabile  ? Other   ?  "Any medicines with steroids in them"  ? ? ?Current Outpatient Medications on File Prior to Visit  ?Medication Sig Dispense Refill  ? aspirin 81 MG tablet Take 81 mg by  mouth daily.    ? fluticasone (FLONASE) 50 MCG/ACT nasal spray Place 2 sprays into both nostrils daily.    ? ibuprofen (ADVIL,MOTRIN) 200 MG tablet Take 200 mg by mouth every 6 (six) hours as needed.    ? lisinopril (ZESTRIL) 10 MG tablet TAKE 1 TABLET BY MOUTH EVERY DAY FOR BLOOD PRESSURE. Office visit required for further refills. 30 tablet 0  ? loratadine (CLARITIN) 10 MG tablet Take 10 mg by mouth daily.    ? ?No current facility-administered medications on file prior to visit.  ? ? ?BP 130/82 (BP Location: Left Arm, Patient Position: Sitting, Cuff Size: Normal)   Pulse (!) 108   Temp 98 ?F (36.7 ?C) (Temporal)   Ht 5' 6.75" (1.695 m)   Wt 245 lb (111.1 kg)   SpO2 97%   BMI 38.66 kg/m?  ?Objective:  ?  Physical Exam ?HENT:  ?   Right Ear: Tympanic membrane and ear canal normal.  ?   Left Ear: Tympanic membrane and ear canal normal.  ?   Nose: Nose normal.  ?   Right Sinus: No maxillary sinus tenderness or frontal sinus tenderness.  ?   Left Sinus: No maxillary sinus tenderness or frontal sinus tenderness.  ?Eyes:  ?   Conjunctiva/sclera: Conjunctivae normal.  ?Neck:  ?   Thyroid: No thyromegaly.  ?   Vascular: No carotid bruit.  ?Cardiovascular:  ?   Rate and Rhythm: Normal rate and regular rhythm.  ?   Heart sounds: Normal heart sounds.  ?Pulmonary:  ?   Effort: Pulmonary effort is normal.  ?   Breath sounds: Normal breath sounds. No wheezing or rales.  ?Abdominal:  ?   General: Bowel sounds are normal.  ?   Palpations: Abdomen is soft.  ?   Tenderness: There is no abdominal tenderness.  ?Musculoskeletal:     ?   General: Normal range of motion.  ?   Cervical back: Neck supple.  ?Skin: ?   General: Skin is warm and dry.  ?Neurological:  ?   Mental Status: He is alert and oriented to person, place, and time.  ?   Cranial Nerves: No cranial nerve deficit.  ?   Deep Tendon Reflexes: Reflexes are normal and symmetric.  ?Psychiatric:     ?   Mood and Affect: Mood normal.  ? ? ? ? ? ?   ?Assessment & Plan:  ? ? ? ? ?This visit occurred during the SARS-CoV-2 public health emergency.  Safety protocols were in place, including screening questions prior to the visit, additional usage of staff PPE, and extensive cleaning of exam room while observing appropriate contact time as indicated for disinfecting solutions.  ?

## 2021-06-02 NOTE — Assessment & Plan Note (Signed)
Uncontrolled with LDL of 171 last year. ?He declined treatment last year. ? ?Repeat lipid panel pending today. ?Consider treatment. ?

## 2021-06-11 ENCOUNTER — Other Ambulatory Visit (INDEPENDENT_AMBULATORY_CARE_PROVIDER_SITE_OTHER): Payer: BC Managed Care – PPO

## 2021-06-11 ENCOUNTER — Other Ambulatory Visit: Payer: Self-pay | Admitting: Primary Care

## 2021-06-11 DIAGNOSIS — E875 Hyperkalemia: Secondary | ICD-10-CM

## 2021-06-11 DIAGNOSIS — I10 Essential (primary) hypertension: Secondary | ICD-10-CM

## 2021-06-11 DIAGNOSIS — R739 Hyperglycemia, unspecified: Secondary | ICD-10-CM

## 2021-06-11 DIAGNOSIS — E785 Hyperlipidemia, unspecified: Secondary | ICD-10-CM

## 2021-06-11 LAB — CBC
HCT: 43 % (ref 39.0–52.0)
Hemoglobin: 14.5 g/dL (ref 13.0–17.0)
MCHC: 33.7 g/dL (ref 30.0–36.0)
MCV: 89.1 fl (ref 78.0–100.0)
Platelets: 183 10*3/uL (ref 150.0–400.0)
RBC: 4.82 Mil/uL (ref 4.22–5.81)
RDW: 14.1 % (ref 11.5–15.5)
WBC: 4.8 10*3/uL (ref 4.0–10.5)

## 2021-06-11 LAB — LIPID PANEL
Cholesterol: 219 mg/dL — ABNORMAL HIGH (ref 0–200)
HDL: 54.5 mg/dL (ref >39.00)
LDL Cholesterol: 146 mg/dL — ABNORMAL HIGH (ref 0–99)
NonHDL: 164.94
Total CHOL/HDL Ratio: 4
Triglycerides: 95 mg/dL (ref 0.0–149.0)
VLDL: 19 mg/dL (ref 0.0–40.0)

## 2021-06-11 LAB — COMPREHENSIVE METABOLIC PANEL
ALT: 16 U/L (ref 0–53)
AST: 18 U/L (ref 0–37)
Albumin: 4.5 g/dL (ref 3.5–5.2)
Alkaline Phosphatase: 36 U/L — ABNORMAL LOW (ref 39–117)
BUN: 17 mg/dL (ref 6–23)
CO2: 27 mEq/L (ref 19–32)
Calcium: 9.7 mg/dL (ref 8.4–10.5)
Chloride: 102 mEq/L (ref 96–112)
Creatinine, Ser: 0.76 mg/dL (ref 0.40–1.50)
GFR: 106.01 mL/min (ref >60.00)
Glucose, Bld: 112 mg/dL — ABNORMAL HIGH (ref 70–99)
Potassium: 5.4 mEq/L — ABNORMAL HIGH (ref 3.5–5.1)
Sodium: 137 mEq/L (ref 135–145)
Total Bilirubin: 0.4 mg/dL (ref 0.2–1.2)
Total Protein: 7 g/dL (ref 6.0–8.3)

## 2021-06-25 ENCOUNTER — Other Ambulatory Visit (INDEPENDENT_AMBULATORY_CARE_PROVIDER_SITE_OTHER): Payer: BC Managed Care – PPO

## 2021-06-25 DIAGNOSIS — E875 Hyperkalemia: Secondary | ICD-10-CM | POA: Diagnosis not present

## 2021-06-25 DIAGNOSIS — R739 Hyperglycemia, unspecified: Secondary | ICD-10-CM

## 2021-06-25 LAB — HEMOGLOBIN A1C: Hgb A1c MFr Bld: 5.5 % (ref 4.6–6.5)

## 2021-06-25 LAB — POTASSIUM: Potassium: 4.5 mEq/L (ref 3.5–5.1)

## 2021-12-06 ENCOUNTER — Encounter: Payer: Self-pay | Admitting: Family Medicine

## 2021-12-06 ENCOUNTER — Ambulatory Visit: Payer: BC Managed Care – PPO | Admitting: Family Medicine

## 2021-12-06 DIAGNOSIS — J0100 Acute maxillary sinusitis, unspecified: Secondary | ICD-10-CM | POA: Diagnosis not present

## 2021-12-06 HISTORY — DX: Acute maxillary sinusitis, unspecified: J01.00

## 2021-12-06 MED ORDER — AMOXICILLIN-POT CLAVULANATE 875-125 MG PO TABS: 1.0000 | ORAL_TABLET | Freq: Two times a day (BID) | ORAL | 0 refills | Status: DC

## 2021-12-06 NOTE — Progress Notes (Signed)
duration of symptoms: ~ 1 week.  rhinorrhea: no congestion: yes ear pain: no sore throat: from post nasal gtt.   cough: mild  myalgias: no No fevers.   Sinus pain and pressure x 1 week. Has been taking otc sinus meds and cold and flu meds. Patient did take two covid tests last week and both were neg. No vomiting. No diarrhea.   He was able to tolerate flonase recently.  D/w pt.   Per HPI unless specifically indicated in ROS section   Meds, vitals, and allergies reviewed.   GEN: nad, alert and oriented HEENT: mucous membranes moist, TM w/o erythema, nasal epithelium injected, OP with cobblestoning, max sinuses ttp B NECK: supple w/o LA CV: rrr. PULM: ctab, no inc wob ABD: soft, +bs EXT: no edema

## 2021-12-06 NOTE — Assessment & Plan Note (Signed)
Nontoxic.  Rest, fluids, augmentin, and update [place] as needed.  He agrees.

## 2021-12-06 NOTE — Patient Instructions (Signed)
Rest, fluids, augmentin, and update [place] as needed.  Take care.  Glad to see you.

## 2022-04-28 ENCOUNTER — Other Ambulatory Visit: Payer: Self-pay | Admitting: Primary Care

## 2022-04-28 DIAGNOSIS — I10 Essential (primary) hypertension: Secondary | ICD-10-CM

## 2022-04-28 NOTE — Telephone Encounter (Signed)
Patient is due for CPE/follow up for mid April, this will be required prior to any further refills.   Also, please check to see if he has enough lisinopril on hand to make it until then.

## 2022-04-29 NOTE — Telephone Encounter (Signed)
Scheduled appt for 06/01/22. He does not have enough to last him till appointment. He has about 10 pills left.

## 2022-04-29 NOTE — Telephone Encounter (Signed)
Unable to reach patient. Left voicemail to return call to our office.   

## 2022-04-30 NOTE — Telephone Encounter (Signed)
Noted. Refill(s) sent to pharmacy.  

## 2022-06-03 ENCOUNTER — Ambulatory Visit: Payer: BC Managed Care – PPO | Admitting: Primary Care

## 2022-06-03 ENCOUNTER — Encounter: Payer: Self-pay | Admitting: Primary Care

## 2022-06-03 VITALS — BP 132/88 | HR 82 | Temp 98.1°F | Ht 66.75 in | Wt 245.0 lb

## 2022-06-03 DIAGNOSIS — K6289 Other specified diseases of anus and rectum: Secondary | ICD-10-CM

## 2022-06-03 DIAGNOSIS — I10 Essential (primary) hypertension: Secondary | ICD-10-CM

## 2022-06-03 DIAGNOSIS — M542 Cervicalgia: Secondary | ICD-10-CM | POA: Diagnosis not present

## 2022-06-03 DIAGNOSIS — J309 Allergic rhinitis, unspecified: Secondary | ICD-10-CM

## 2022-06-03 DIAGNOSIS — E785 Hyperlipidemia, unspecified: Secondary | ICD-10-CM

## 2022-06-03 DIAGNOSIS — M549 Dorsalgia, unspecified: Secondary | ICD-10-CM

## 2022-06-03 DIAGNOSIS — G8929 Other chronic pain: Secondary | ICD-10-CM

## 2022-06-03 DIAGNOSIS — Z0000 Encounter for general adult medical examination without abnormal findings: Secondary | ICD-10-CM

## 2022-06-03 LAB — COMPREHENSIVE METABOLIC PANEL
ALT: 16 U/L (ref 0–53)
AST: 19 U/L (ref 0–37)
Albumin: 4.6 g/dL (ref 3.5–5.2)
Alkaline Phosphatase: 33 U/L — ABNORMAL LOW (ref 39–117)
BUN: 13 mg/dL (ref 6–23)
CO2: 29 mEq/L (ref 19–32)
Calcium: 9.8 mg/dL (ref 8.4–10.5)
Chloride: 102 mEq/L (ref 96–112)
Creatinine, Ser: 0.78 mg/dL (ref 0.40–1.50)
GFR: 104.46 mL/min (ref >60.00)
Glucose, Bld: 107 mg/dL — ABNORMAL HIGH (ref 70–99)
Potassium: 4.4 mEq/L (ref 3.5–5.1)
Sodium: 139 mEq/L (ref 135–145)
Total Bilirubin: 0.6 mg/dL (ref 0.2–1.2)
Total Protein: 7.3 g/dL (ref 6.0–8.3)

## 2022-06-03 LAB — LIPID PANEL
Cholesterol: 207 mg/dL — ABNORMAL HIGH (ref 0–200)
HDL: 53.6 mg/dL (ref >39.00)
LDL Cholesterol: 136 mg/dL — ABNORMAL HIGH (ref 0–99)
NonHDL: 153.71
Total CHOL/HDL Ratio: 4
Triglycerides: 87 mg/dL (ref 0.0–149.0)
VLDL: 17.4 mg/dL (ref 0.0–40.0)

## 2022-06-03 LAB — HEMOGLOBIN A1C: Hgb A1c MFr Bld: 5.6 % (ref 4.6–6.5)

## 2022-06-03 NOTE — Assessment & Plan Note (Signed)
Controlled. ? ?Continue lisinopril 10 mg daily. ?CMP pending. ?

## 2022-06-03 NOTE — Progress Notes (Signed)
Subjective:    Patient ID: Kevin Bailey, male    DOB: 1970/09/12, 52 y.o.   MRN: [id number]  HPI  Kevin Bailey is a very pleasant 52 y.o. male who presents today for complete physical and follow up of chronic conditions.  He would also like to discuss recurrent perianal abscess. Intermittent since his colonoscopy in 2023, occurs every few months, flares with increased activity and sweating.   Immunizations: -Tetanus: Completed >10 years ago. Declines today.  Diet: Fair diet.  Exercise: No regular exercise.  Eye exam: Completes annually  Dental exam: Completes semi-annually    Colonoscopy: Completed in 2023, due 2028  BP Readings from Last 3 Encounters:  06/03/22 132/88  12/06/21 124/80  06/02/21 130/82     Review of Systems  Constitutional:  Negative for unexpected weight change.  HENT:  Negative for rhinorrhea.   Respiratory:  Negative for cough and shortness of breath.   Cardiovascular:  Negative for chest pain.  Gastrointestinal:  Negative for constipation and diarrhea.       Recurrent perianal cyst  Genitourinary:  Negative for difficulty urinating.  Musculoskeletal:  Negative for arthralgias and myalgias.  Skin:  Negative for rash.  Allergic/Immunologic: Negative for environmental allergies.  Neurological:  Negative for dizziness, numbness and headaches.  Psychiatric/Behavioral:  The patient is not nervous/anxious.          Past Medical History:  Diagnosis Date   Acid reflux    Acute non-recurrent maxillary sinusitis 12/06/2021   Costochondritis 01/12/2015   Heart murmur    Hyperlipidemia    Hypertension    Sinus infection     Social History   Socioeconomic History   Marital status: Married    Spouse name: [name]   Number of children: Not on file   Years of education: 12   Highest education level: 12th grade  Occupational History   Occupation: Financial planner  Tobacco Use   Smoking status: Never   Smokeless tobacco:  Never  Substance and Sexual Activity   Alcohol use: Yes    Alcohol/week: 2.0 standard drinks of alcohol    Types: 2 Standard drinks or equivalent per week   Drug use: No   Sexual activity: Yes    Partners: Female  Other Topics Concern   Not on file  Social History Narrative   Lives with wife, [name]   No children.   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    Social Determinants of Health   Financial Resource Strain: Low Risk  (06/02/2022)   Overall Financial Resource Strain (CARDIA)    Difficulty of Paying Living Expenses: Not hard at all  Food Insecurity: No Food Insecurity (06/02/2022)   Hunger Vital Sign    Worried About Running Out of Food in the Last Year: Never true    Ran Out of Food in the Last Year: Never true  Transportation Needs: No Transportation Needs (06/02/2022)   PRAPARE - Administrator, Civil Service (Medical): No    Lack of Transportation (Non-Medical): No  Physical Activity: Sufficiently Active (06/02/2022)   Exercise Vital Sign    Days of Exercise per Week: 7 days    Minutes of Exercise per Session: 50 min  Stress: No Stress Concern Present (06/02/2022)   [employer] of Occupational Health - Occupational Stress Questionnaire    Feeling of Stress : Not at all  Social Connections: Unknown (06/02/2022)   Social  Connection and Isolation Panel [NHANES]    Frequency of Communication with Friends and Family: More than three times a week    Frequency of Social Gatherings with Friends and Family: Once a week    Attends Religious Services: Patient declined    Database administrator or Organizations: No    Attends Engineer, structural: Not on file    Marital Status: Married  Catering manager Violence: Not on file    Past Surgical History:  Procedure Laterality Date   WISDOM TOOTH EXTRACTION      Family History  Problem Relation Age of Onset   Hypertension Father    Anemia  Father    Diabetes Other        grandmother    Diabetes Paternal Grandmother    Heart disease Paternal Grandfather    Neuropathy Neg Hx    Neurofibromatosis Neg Hx    Multiple sclerosis Neg Hx     Allergies  Allergen Reactions   Hydrochlorothiazide Other (See Comments)    Dizziness, felt weak, fatigued   Prednisone Other (See Comments)    Hypotension   Other     "Any medicines with steroids in them"    Current Outpatient Medications on File Prior to Visit  Medication Sig Dispense Refill   aspirin 81 MG tablet Take 81 mg by mouth daily.     fluticasone (FLONASE) 50 MCG/ACT nasal spray Place 2 sprays into both nostrils daily.     ibuprofen (ADVIL,MOTRIN) 200 MG tablet Take 200 mg by mouth every 6 (six) hours as needed.     lisinopril (ZESTRIL) 10 MG tablet TAKE 1 TABLET BY MOUTH EVERY DAY FOR BLOOD PRESSURE 90 tablet 0   loratadine (CLARITIN) 10 MG tablet Take 10 mg by mouth daily.     No current facility-administered medications on file prior to visit.    BP 132/88   Pulse 82   Temp 98.1 F (36.7 C) (Temporal)   Ht 5' 6.75" (1.695 m)   Wt 245 lb (111.1 kg)   SpO2 97%   BMI 38.66 kg/m  Objective:   Physical Exam HENT:     Right Ear: Tympanic membrane and ear canal normal.     Left Ear: Tympanic membrane and ear canal normal.     Nose: Nose normal.     Right Sinus: No maxillary sinus tenderness or frontal sinus tenderness.     Left Sinus: No maxillary sinus tenderness or frontal sinus tenderness.  Eyes:     Conjunctiva/sclera: Conjunctivae normal.  Neck:     Thyroid: No thyromegaly.     Vascular: No carotid bruit.  Cardiovascular:     Rate and Rhythm: Normal rate and regular rhythm.     Heart sounds: Normal heart sounds.  Pulmonary:     Effort: Pulmonary effort is normal.     Breath sounds: Normal breath sounds. No wheezing or rales.  Abdominal:     General: Bowel sounds are normal.     Palpations: Abdomen is soft.     Tenderness: There is no abdominal  tenderness.  Musculoskeletal:        General: Normal range of motion.     Cervical back: Neck supple.  Skin:    General: Skin is warm and dry.  Neurological:     Mental Status: He is alert and oriented to person, place, and time.     Cranial Nerves: No cranial nerve deficit.     Deep Tendon Reflexes: Reflexes are normal and symmetric.  Psychiatric:        Mood and Affect: Mood normal.           Assessment & Plan:  Preventative health care Assessment & Plan: Tetanus due, declines today. Colonoscopy UTD, due 2028  Discussed the importance of a healthy diet and regular exercise in order for weight loss, and to reduce the risk of further co-morbidity.  Exam stable. Labs pending.  Follow up in 1 year for repeat physical.    Essential hypertension Assessment & Plan: Controlled.  Continue lisinopril 10 mg daily.  CMP pending.  Orders: -     Comprehensive metabolic panel -     Hemoglobin A1c  Chronic neck and back pain Assessment & Plan: Stable.   Continue to monitor.  No concerns today.   Allergic rhinitis, unspecified seasonality, unspecified trigger Assessment & Plan: Controlled.  Continue Claritin 10 mg daily and Flonase PRN.   Hyperlipidemia, unspecified hyperlipidemia type Assessment & Plan: Repeat lipid panel pending.  Discussed the importance of a healthy diet and regular exercise in order for weight loss, and to reduce the risk of further co-morbidity.   Orders: -     Lipid panel -     Comprehensive metabolic panel -     Hemoglobin A1c  Perianal cyst Assessment & Plan: Recurring.  No cyst today. Discussed sitz bath soaks, bath soaks, etc.  Offered general surgery consultation, he kindly declines for now but will update.         Doreene Nest, NP

## 2022-06-03 NOTE — Assessment & Plan Note (Signed)
Tetanus due, declines today. Colonoscopy UTD, due 2028  Discussed the importance of a healthy diet and regular exercise in order for weight loss, and to reduce the risk of further co-morbidity.  Exam stable. Labs pending.  Follow up in 1 year for repeat physical.

## 2022-06-03 NOTE — Assessment & Plan Note (Signed)
Controlled.  Continue Claritin 10 mg daily and Flonase PRN.

## 2022-06-03 NOTE — Assessment & Plan Note (Signed)
Repeat lipid panel pending.  Discussed the importance of a healthy diet and regular exercise in order for weight loss, and to reduce the risk of further co-morbidity.  

## 2022-06-03 NOTE — Assessment & Plan Note (Signed)
Stable.   Continue to monitor.  No concerns today.

## 2022-06-03 NOTE — Assessment & Plan Note (Signed)
Recurring.  No cyst today. Discussed sitz bath soaks, bath soaks, etc.  Offered general surgery consultation, he kindly declines for now but will update.

## 2022-06-03 NOTE — Patient Instructions (Signed)
Stop by the lab prior to leaving today. I will notify you of your results once received.   It was a pleasure to see you today!  

## 2022-07-28 ENCOUNTER — Other Ambulatory Visit: Payer: Self-pay | Admitting: Primary Care

## 2022-07-28 DIAGNOSIS — I10 Essential (primary) hypertension: Secondary | ICD-10-CM

## 2023-04-21 ENCOUNTER — Other Ambulatory Visit: Payer: Self-pay | Admitting: Primary Care

## 2023-04-21 DIAGNOSIS — I10 Essential (primary) hypertension: Secondary | ICD-10-CM

## 2023-04-21 NOTE — Telephone Encounter (Signed)
 Please call patient:  His CPE is scheduled for 05/12/23, his last CPE was 06/03/22. Does he need full calendar year for CPE with his insurance? If not then ok, if so, then reschedule.

## 2023-04-21 NOTE — Telephone Encounter (Signed)
 Call pt and schedule a appt for cpe

## 2023-04-24 DIAGNOSIS — H524 Presbyopia: Secondary | ICD-10-CM | POA: Diagnosis not present

## 2023-05-12 ENCOUNTER — Encounter: Payer: BC Managed Care – PPO | Admitting: Primary Care

## 2023-06-15 ENCOUNTER — Encounter: Payer: Self-pay | Admitting: Primary Care

## 2023-06-16 ENCOUNTER — Ambulatory Visit (INDEPENDENT_AMBULATORY_CARE_PROVIDER_SITE_OTHER): Payer: BC Managed Care – PPO | Admitting: Primary Care

## 2023-06-16 VITALS — BP 124/76 | HR 95 | Temp 97.6°F | Ht 66.75 in

## 2023-06-16 DIAGNOSIS — Z125 Encounter for screening for malignant neoplasm of prostate: Secondary | ICD-10-CM

## 2023-06-16 DIAGNOSIS — K6289 Other specified diseases of anus and rectum: Secondary | ICD-10-CM

## 2023-06-16 DIAGNOSIS — E785 Hyperlipidemia, unspecified: Secondary | ICD-10-CM

## 2023-06-16 DIAGNOSIS — Z0000 Encounter for general adult medical examination without abnormal findings: Secondary | ICD-10-CM

## 2023-06-16 DIAGNOSIS — I10 Essential (primary) hypertension: Secondary | ICD-10-CM

## 2023-06-16 NOTE — Assessment & Plan Note (Signed)
 Controlled. ? ?Continue lisinopril 10 mg daily. ?CMP pending. ?

## 2023-06-16 NOTE — Patient Instructions (Signed)
 Schedule a lab appointment for when you are fasting.  It was a pleasure to see you today!

## 2023-06-16 NOTE — Assessment & Plan Note (Signed)
 Ongoing.  Will be establishing with rectal specialist.

## 2023-06-16 NOTE — Progress Notes (Signed)
 Subjective:    Patient ID: Kevin Bailey, male    DOB: 21-Nov-1970, 53 y.o.   MRN: [id number]  HPI  Kevin Bailey is a very pleasant 53 y.o. male who presents today for complete physical and follow up of chronic conditions.  Immunizations: -Tetanus: Completed > 10 years ago, declines  -Influenza: Completed last season  -Shingles: Never completed, declines   Diet: Fair diet.  Exercise: Regular exercise, walking daily   Eye exam: Completes annually  Dental exam: Completes semi-annually    Colonoscopy: Completed in 2023, due 2028  PSA: Due  BP Readings from Last 3 Encounters:  06/16/23 124/76  06/03/22 132/88  12/06/21 124/80    Wt Readings from Last 3 Encounters:  06/03/22 245 lb (111.1 kg)  12/06/21 247 lb (112 kg)  06/02/21 245 lb (111.1 kg)        Review of Systems  Constitutional:  Negative for unexpected weight change.  HENT:  Negative for rhinorrhea.   Respiratory:  Negative for cough and shortness of breath.   Cardiovascular:  Negative for chest pain.  Gastrointestinal:  Negative for constipation and diarrhea.  Genitourinary:  Negative for difficulty urinating.  Musculoskeletal:  Positive for arthralgias.  Skin:  Negative for rash.  Allergic/Immunologic: Negative for environmental allergies.  Neurological:  Negative for dizziness and headaches.  Psychiatric/Behavioral:  The patient is not nervous/anxious.          Past Medical History:  Diagnosis Date   Acid reflux    Acute non-recurrent maxillary sinusitis 12/06/2021   Allergy    Hay Fever   Costochondritis 01/12/2015   Heart murmur    Hyperlipidemia    Hypertension    Sinus infection     Social History   Socioeconomic History   Marital status: Married    Spouse name: [name]   Number of children: Not on file   Years of education: 12   Highest education level: 12th grade  Occupational History   Occupation: Financial planner  Tobacco Use   Smoking status: Never    Smokeless tobacco: Never  Substance and Sexual Activity   Alcohol use: Yes    Alcohol/week: 2.0 standard drinks of alcohol    Types: 2 Standard drinks or equivalent per week   Drug use: No   Sexual activity: Yes    Partners: Female  Other Topics Concern   Not on file  Social History Narrative   Lives with wife, [name]   No children.   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    Social Drivers of Corporate investment banker Strain: Low Risk  (06/14/2023)   Overall Financial Resource Strain (CARDIA)    Difficulty of Paying Living Expenses: Not hard at all  Food Insecurity: No Food Insecurity (06/14/2023)   Hunger Vital Sign    Worried About Running Out of Food in the Last Year: Never true    Ran Out of Food in the Last Year: Never true  Transportation Needs: No Transportation Needs (06/14/2023)   PRAPARE - Administrator, Civil Service (Medical): No    Lack of Transportation (Non-Medical): No  Physical Activity: Sufficiently Active (06/14/2023)   Exercise Vital Sign    Days of Exercise per Week: 6 days    Minutes of Exercise per Session: 40 min  Stress: No Stress Concern Present (06/14/2023)   [employer] of Occupational Health - Occupational Stress Questionnaire  Feeling of Stress : Not at all  Social Connections: Unknown (06/14/2023)   Social Connection and Isolation Panel [NHANES]    Frequency of Communication with Friends and Family: Once a week    Frequency of Social Gatherings with Friends and Family: Once a week    Attends Religious Services: Patient declined    Database administrator or Organizations: Patient declined    Attends Engineer, structural: Not on file    Marital Status: Married  Catering manager Violence: Not on file    Past Surgical History:  Procedure Laterality Date   WISDOM TOOTH EXTRACTION      Family History  Problem Relation Age of Onset   Hypertension  Father    Anemia Father    Diabetes Other        grandmother    Diabetes Paternal Grandmother    Heart disease Paternal Grandfather    Neuropathy Neg Hx    Neurofibromatosis Neg Hx    Multiple sclerosis Neg Hx     Allergies  Allergen Reactions   Hydrochlorothiazide  Other (See Comments)    Dizziness, felt weak, fatigued   Prednisone  Other (See Comments)    Hypotension   Other     "Any medicines with steroids in them"    Current Outpatient Medications on File Prior to Visit  Medication Sig Dispense Refill   aspirin 81 MG tablet Take 81 mg by mouth daily.     fluticasone  (FLONASE ) 50 MCG/ACT nasal spray Place 2 sprays into both nostrils daily.     ibuprofen (ADVIL,MOTRIN) 200 MG tablet Take 200 mg by mouth every 6 (six) hours as needed.     lisinopril  (ZESTRIL ) 10 MG tablet TAKE 1 TABLET BY MOUTH EVERY DAY FOR BLOOD PRESSURE 90 tablet 0   loratadine (CLARITIN) 10 MG tablet Take 10 mg by mouth daily.     No current facility-administered medications on file prior to visit.    BP 124/76   Pulse 95   Temp 97.6 F (36.4 C) (Temporal)   Ht 5' 6.75" (1.695 m)   SpO2 96%   BMI 38.66 kg/m  Objective:   Physical Exam HENT:     Right Ear: Tympanic membrane and ear canal normal.     Left Ear: Tympanic membrane and ear canal normal.  Eyes:     Pupils: Pupils are equal, round, and reactive to light.  Cardiovascular:     Rate and Rhythm: Normal rate and regular rhythm.  Pulmonary:     Effort: Pulmonary effort is normal.     Breath sounds: Normal breath sounds.  Abdominal:     Kevin: Bowel sounds are normal.     Palpations: Abdomen is soft.     Tenderness: There is no abdominal tenderness.  Musculoskeletal:        Kevin: Normal range of motion.     Cervical back: Neck supple.  Skin:    Kevin: Skin is warm and dry.  Neurological:     Mental Status: He is alert and oriented to person, place, and time.     Cranial Nerves: No cranial nerve deficit.     Deep Tendon  Reflexes:     Reflex Scores:      Patellar reflexes are 2+ on the right side and 2+ on the left side. Psychiatric:        Mood and Affect: Mood normal.           Assessment & Plan:  Preventative health care Assessment & Plan: Declines  tetanus and Shingrix vaccines. Colonoscopy UTD, due 2028 PSA due and pending.  Discussed the importance of a healthy diet and regular exercise in order for weight loss, and to reduce the risk of further co-morbidity.  Exam stable. Labs pending.  Follow up in 1 year for repeat physical.    Essential hypertension Assessment & Plan: Controlled.  Continue lisinopril  10 mg daily. CMP pending.  Orders: -     CBC; Future -     Comprehensive metabolic panel with GFR; Future  Perianal cyst Assessment & Plan: Ongoing.  Will be establishing with rectal specialist.    Hyperlipidemia, unspecified hyperlipidemia type Assessment & Plan: Repeat lipid panel pending.   Orders: -     Lipid panel; Future  Screening for prostate cancer -     PSA; Future        Cohl Behrens K Eh Sauseda, NP

## 2023-06-16 NOTE — Assessment & Plan Note (Signed)
 Declines tetanus and Shingrix vaccines. Colonoscopy UTD, due 2028 PSA due and pending.  Discussed the importance of a healthy diet and regular exercise in order for weight loss, and to reduce the risk of further co-morbidity.  Exam stable. Labs pending.  Follow up in 1 year for repeat physical.

## 2023-06-16 NOTE — Assessment & Plan Note (Signed)
 Repeat lipid panel pending.

## 2023-06-22 ENCOUNTER — Other Ambulatory Visit: Payer: Self-pay | Admitting: Primary Care

## 2023-06-22 ENCOUNTER — Other Ambulatory Visit

## 2023-06-22 DIAGNOSIS — E785 Hyperlipidemia, unspecified: Secondary | ICD-10-CM

## 2023-06-22 DIAGNOSIS — R7303 Prediabetes: Secondary | ICD-10-CM

## 2023-06-22 DIAGNOSIS — R739 Hyperglycemia, unspecified: Secondary | ICD-10-CM

## 2023-06-22 DIAGNOSIS — Z125 Encounter for screening for malignant neoplasm of prostate: Secondary | ICD-10-CM

## 2023-06-22 DIAGNOSIS — I10 Essential (primary) hypertension: Secondary | ICD-10-CM

## 2023-06-22 LAB — LIPID PANEL
Cholesterol: 228 mg/dL — ABNORMAL HIGH (ref 0–200)
HDL: 62.2 mg/dL (ref >39.00)
LDL Cholesterol: 149 mg/dL — ABNORMAL HIGH (ref 0–99)
NonHDL: 165.94
Total CHOL/HDL Ratio: 4
Triglycerides: 85 mg/dL (ref 0.0–149.0)
VLDL: 17 mg/dL (ref 0.0–40.0)

## 2023-06-22 LAB — COMPREHENSIVE METABOLIC PANEL WITH GFR
ALT: 19 U/L (ref 0–53)
AST: 22 U/L (ref 0–37)
Albumin: 4.6 g/dL (ref 3.5–5.2)
Alkaline Phosphatase: 36 U/L — ABNORMAL LOW (ref 39–117)
BUN: 13 mg/dL (ref 6–23)
CO2: 29 meq/L (ref 19–32)
Calcium: 9.8 mg/dL (ref 8.4–10.5)
Chloride: 101 meq/L (ref 96–112)
Creatinine, Ser: 0.7 mg/dL (ref 0.40–1.50)
GFR: 107.14 mL/min (ref >60.00)
Glucose, Bld: 110 mg/dL — ABNORMAL HIGH (ref 70–99)
Potassium: 4.7 meq/L (ref 3.5–5.1)
Sodium: 137 meq/L (ref 135–145)
Total Bilirubin: 0.5 mg/dL (ref 0.2–1.2)
Total Protein: 7.2 g/dL (ref 6.0–8.3)

## 2023-06-22 LAB — CBC
HCT: 46 % (ref 39.0–52.0)
Hemoglobin: 15.2 g/dL (ref 13.0–17.0)
MCHC: 33.2 g/dL (ref 30.0–36.0)
MCV: 91.4 fl (ref 78.0–100.0)
Platelets: 203 10*3/uL (ref 150.0–400.0)
RBC: 5.03 Mil/uL (ref 4.22–5.81)
RDW: 14 % (ref 11.5–15.5)
WBC: 5.4 10*3/uL (ref 4.0–10.5)

## 2023-06-22 LAB — PSA: PSA: 1.27 ng/mL (ref 0.10–4.00)

## 2023-06-22 LAB — HEMOGLOBIN A1C: Hgb A1c MFr Bld: 5.7 % (ref 4.6–6.5)

## 2023-07-20 ENCOUNTER — Other Ambulatory Visit: Payer: Self-pay | Admitting: Primary Care

## 2023-07-20 DIAGNOSIS — I10 Essential (primary) hypertension: Secondary | ICD-10-CM
